# Patient Record
Sex: Female | Born: 1980 | Race: White | Hispanic: No | Smoking: Never smoker
Health system: Southern US, Community
[De-identification: ages and names within clinical notes are randomized; demographics above are authoritative.]

## PROBLEM LIST (undated history)

## (undated) DIAGNOSIS — B9689 Other specified bacterial agents as the cause of diseases classified elsewhere: Secondary | ICD-10-CM

## (undated) DIAGNOSIS — N760 Acute vaginitis: Secondary | ICD-10-CM

## (undated) DIAGNOSIS — R011 Cardiac murmur, unspecified: Secondary | ICD-10-CM

## (undated) DIAGNOSIS — K219 Gastro-esophageal reflux disease without esophagitis: Secondary | ICD-10-CM

## (undated) HISTORY — DX: Cardiac murmur, unspecified: R01.1

## (undated) HISTORY — PX: COSMETIC SURGERY: SHX468

## (undated) HISTORY — PX: NASAL SEPTUM SURGERY: SHX37

## (undated) HISTORY — DX: Gastro-esophageal reflux disease without esophagitis: K21.9

## (undated) HISTORY — PX: TUBAL LIGATION: SHX77

---

## 2016-09-07 DIAGNOSIS — Z9851 Tubal ligation status: Secondary | ICD-10-CM | POA: Insufficient documentation

## 2019-11-17 ENCOUNTER — Other Ambulatory Visit (HOSPITAL_COMMUNITY)
Admission: RE | Admit: 2019-11-17 | Discharge: 2019-11-17 | Disposition: A | Discharge status: Home / Self Care | Payer: BC Managed Care – PPO | Source: Ambulatory Visit | Attending: Certified Nurse Midwife | Admitting: Certified Nurse Midwife

## 2019-11-17 ENCOUNTER — Other Ambulatory Visit: Payer: Self-pay

## 2019-11-17 ENCOUNTER — Encounter: Payer: Self-pay | Admitting: Certified Nurse Midwife

## 2019-11-17 ENCOUNTER — Ambulatory Visit: Payer: BC Managed Care – PPO | Admitting: Certified Nurse Midwife

## 2019-11-17 VITALS — BP 108/73 | HR 76 | Ht 68.0 in | Wt 132.1 lb

## 2019-11-17 DIAGNOSIS — Z23 Encounter for immunization: Secondary | ICD-10-CM

## 2019-11-17 DIAGNOSIS — R300 Dysuria: Secondary | ICD-10-CM | POA: Diagnosis not present

## 2019-11-17 DIAGNOSIS — B373 Candidiasis of vulva and vagina: Secondary | ICD-10-CM | POA: Insufficient documentation

## 2019-11-17 DIAGNOSIS — N760 Acute vaginitis: Secondary | ICD-10-CM | POA: Diagnosis not present

## 2019-11-17 DIAGNOSIS — N898 Other specified noninflammatory disorders of vagina: Secondary | ICD-10-CM | POA: Insufficient documentation

## 2019-11-17 DIAGNOSIS — B9689 Other specified bacterial agents as the cause of diseases classified elsewhere: Secondary | ICD-10-CM | POA: Insufficient documentation

## 2019-11-17 NOTE — Patient Instructions (Signed)
Vaginitis Vaginitis is a condition in which the vaginal tissue swells and becomes red (inflamed). This condition is most often caused by a change in the normal balance of bacteria and yeast that live in the vagina. This change causes an overgrowth of certain bacteria or yeast, which causes the inflammation. There are different types of vaginitis, but the most common types are:  Bacterial vaginosis.  Yeast infection (candidiasis).  Trichomoniasis vaginitis. This is a sexually transmitted disease (STD).  Viral vaginitis.  Atrophic vaginitis.  Allergic vaginitis. What are the causes? The cause of this condition depends on the type of vaginitis. It can be caused by:  Bacteria (bacterial vaginosis).  Yeast, which is a fungus (yeast infection).  A parasite (trichomoniasis vaginitis).  A virus (viral vaginitis).  Low hormone levels (atrophic vaginitis). Low hormone levels can occur during pregnancy, breastfeeding, or after menopause.  Irritants, such as bubble baths, scented tampons, and feminine sprays (allergic vaginitis). Other factors can change the normal balance of the yeast and bacteria that live in the vagina. These include:  Antibiotic medicines.  Poor hygiene.  Diaphragms, vaginal sponges, spermicides, birth control pills, and intrauterine devices (IUD).  Sex.  Infection.  Uncontrolled diabetes.  A weakened defense (immune) system. What increases the risk? This condition is more likely to develop in women who:  Smoke.  Use vaginal douches, scented tampons, or scented sanitary pads.  Wear tight-fitting pants.  Wear thong underwear.  Use oral birth control pills or an IUD.  Have sex without a condom.  Have multiple sex partners.  Have an STD.  Frequently use the spermicide nonoxynol-9.  Eat lots of foods high in sugar.  Have uncontrolled diabetes.  Have low estrogen levels.  Have a weakened immune system from an immune disorder or medical  treatment.  Are pregnant or breastfeeding. What are the signs or symptoms? Symptoms vary depending on the cause of the vaginitis. Common symptoms include:  Abnormal vaginal discharge. ? The discharge is white, gray, or yellow with bacterial vaginosis. ? The discharge is thick, white, and cheesy with a yeast infection. ? The discharge is frothy and yellow or greenish with trichomoniasis.  A bad vaginal smell. The smell is fishy with bacterial vaginosis.  Vaginal itching, pain, or swelling.  Sex that is painful.  Pain or burning when urinating. Sometimes there are no symptoms. How is this diagnosed? This condition is diagnosed based on your symptoms and medical history. A physical exam, including a pelvic exam, will also be done. You may also have other tests, including:  Tests to determine the pH level (acidity or alkalinity) of your vagina.  A whiff test, to assess the odor that results when a sample of your vaginal discharge is mixed with a potassium hydroxide solution.  Tests of vaginal fluid. A sample will be examined under a microscope. How is this treated? Treatment varies depending on the type of vaginitis you have. Your treatment may include:  Antibiotic creams or pills to treat bacterial vaginosis and trichomoniasis.  Antifungal medicines, such as vaginal creams or suppositories, to treat a yeast infection.  Medicine to ease discomfort if you have viral vaginitis. Your sexual partner should also be treated.  Estrogen delivered in a cream, pill, suppository, or vaginal ring to treat atrophic vaginitis. If vaginal dryness occurs, lubricants and moisturizing creams may help. You may need to avoid scented soaps, sprays, or douches.  Stopping use of a product that is causing allergic vaginitis. Then using a vaginal cream to treat the symptoms. Follow   these instructions at home: Lifestyle  Keep your genital area clean and dry. Avoid soap, and only rinse the area with  water.  Do not douche or use tampons until your health care provider says it is okay to do so. Use sanitary pads, if needed.  Do not have sex until your health care provider approves. When you can return to sex, practice safe sex and use condoms.  Wipe from front to back. This avoids the spread of bacteria from the rectum to the vagina. General instructions  Take over-the-counter and prescription medicines only as told by your health care provider.  If you were prescribed an antibiotic medicine, take or use it as told by your health care provider. Do not stop taking or using the antibiotic even if you start to feel better.  Keep all follow-up visits as told by your health care provider. This is important. How is this prevented?  Use mild, non-scented products. Do not use things that can irritate the vagina, such as fabric softeners. Avoid the following products if they are scented: ? Feminine sprays. ? Detergents. ? Tampons. ? Feminine hygiene products. ? Soaps or bubble baths.  Let air reach your genital area. ? Wear cotton underwear to reduce moisture buildup. ? Avoid wearing underwear while you sleep. ? Avoid wearing tight pants and underwear or nylons without a cotton panel. ? Avoid wearing thong underwear.  Take off any wet clothing, such as bathing suits, as soon as possible.  Practice safe sex and use condoms. Contact a health care provider if:  You have abdominal pain.  You have a fever.  You have symptoms that last for more than 2-3 days. Get help right away if:  You have a fever and your symptoms suddenly get worse. Summary  Vaginitis is a condition in which the vaginal tissue becomes inflamed.This condition is most often caused by a change in the normal balance of bacteria and yeast that live in the vagina.  Treatment varies depending on the type of vaginitis you have.  Do not douche, use tampons , or have sex until your health care provider approves. When  you can return to sex, practice safe sex and use condoms. This information is not intended to replace advice given to you by your health care provider. Make sure you discuss any questions you have with your health care provider. Document Revised: 01/04/2017 Document Reviewed: 02/28/2016 Elsevier Patient Education  2020 [employer]  

## 2019-11-17 NOTE — Progress Notes (Signed)
GYN ENCOUNTER NOTE  Subjective:       Jordan Schwartz is a 39 y.o. G24P2002 female is here for gynecologic evaluation of the following issues:  1. Pt has a new partner and has been experiencing increased vaginal discharge with odor and burning. She has a history of UTI's as a child.  She has seen her pcp and had urine culture that showed mixed flora. She has been treated once for BV.    Gynecologic History Patient's last menstrual period was 11/08/2019 (exact date). Contraception: tubal ligation Last Pap: 2 yrs ago Results were: normal Last mammogram: n/a   Obstetric History OB History  Gravida Para Term Preterm AB Living  2 2 2     2   SAB TAB Ectopic Multiple Live Births          2    # Outcome Date GA Lbr Len/2nd Weight Sex Delivery Anes PTL Lv  2 Term 06/23/13   8 lb 6 oz (3.799 kg) M CS-Unspec  N LIV  1 Term 11/04/10   7 lb 15 oz (3.6 kg) M CS-Unspec  N LIV    History reviewed. No pertinent past medical history.  Past Surgical History:  Procedure Laterality Date  . CESAREAN SECTION    . NASAL SEPTUM SURGERY    . TUBAL LIGATION      Current Outpatient Medications on File Prior to Visit  Medication Sig Dispense Refill  . Biotin 1 MG CAPS Take by mouth.    . Multiple Vitamin (MULTI-VITAMIN) tablet Take 1 tablet by mouth every morning.    . norethindrone-ethinyl estradiol-iron (BLISOVI FE 1.5/30) 1.5-30 MG-MCG tablet Take 1 tablet by mouth daily.     No current facility-administered medications on file prior to visit.    Allergies  Allergen Reactions  . Penicillins Rash    Social History   Socioeconomic History  . Marital status: Legally Separated    Spouse name: Not on file  . Number of children: Not on file  . Years of education: Not on file  . Highest education level: Not on file  Occupational History  . Not on file  Tobacco Use  . Smoking status: Never Smoker  . Smokeless tobacco: Never Used  Substance and Sexual Activity  . Alcohol use: Yes  . Drug  use: Never  . Sexual activity: Yes    Birth control/protection: Surgical, Pill  Other Topics Concern  . Not on file  Social History Narrative  . Not on file   Social Determinants of Health   Financial Resource Strain:   . Difficulty of Paying Living Expenses: Not on file  Food Insecurity:   . Worried About 12-09-1976 in the Last Year: Not on file  . Ran Out of Food in the Last Year: Not on file  Transportation Needs:   . Lack of Transportation (Medical): Not on file  . Lack of Transportation (Non-Medical): Not on file  Physical Activity:   . Days of Exercise per Week: Not on file  . Minutes of Exercise per Session: Not on file  Stress:   . Feeling of Stress : Not on file  Social Connections:   . Frequency of Communication with Friends and Family: Not on file  . Frequency of Social Gatherings with Friends and Family: Not on file  . Attends Religious Services: Not on file  . Active Member of Clubs or Organizations: Not on file  . Attends Programme researcher, broadcasting/film/video Meetings: Not on file  . Marital Status:  Not on file  Intimate Partner Violence:   . Fear of Current or Ex-Partner: Not on file  . Emotionally Abused: Not on file  . Physically Abused: Not on file  . Sexually Abused: Not on file    History reviewed. No pertinent family history.  The following portions of the patient's history were reviewed and updated as appropriate: allergies, current medications, past family history, past medical history, past social history, past surgical history and problem list.  Review of Systems Review of Systems - Negative except as mentioned in HPI Review of Systems - General ROS: negative for - chills, fatigue, fever, hot flashes, malaise or night sweats Hematological and Lymphatic ROS: negative for - bleeding problems or swollen lymph nodes Gastrointestinal ROS: negative for - abdominal pain, blood in stools, change in bowel habits and nausea/vomiting Musculoskeletal ROS: negative  for - joint pain, muscle pain or muscular weakness Genito-Urinary ROS: negative for - change in menstrual cycle, dysmenorrhea, dyspareunia, , genital ulcers, hematuria, incontinence, irregular/heavy menses, nocturia or pelvic pain. Positive for dysuria, vaginal odor and discharge.  Objective:   BP 108/73   Pulse 76   Ht 5\' 8"  (1.727 m)   Wt 132 lb 1 oz (59.9 kg)   LMP 11/08/2019 (Exact Date)   BMI 20.08 kg/m  CONSTITUTIONAL: Well-developed, well-nourished female in no acute distress.  HENT:  Normocephalic, atraumatic.  NECK: Normal range of motion, supple, no masses.  Normal thyroid.  SKIN: Skin is warm and dry. No rash noted. Not diaphoretic. No erythema. No pallor. NEUROLGIC: Alert and oriented to person, place, and time. PSYCHIATRIC: Normal mood and affect. Normal behavior. Normal judgment and thought content. CARDIOVASCULAR:Not Examined RESPIRATORY: Not Examined BREASTS: Not Examined ABDOMEN: Soft, non distended; Non tender.  No Organomegaly. PELVIC:  External Genitalia: Normal  BUS: Normal  Vagina: Normal  Cervix: Normal, white to yellow discharge, odor noted, no cervical motion tenderness  Uterus: Normal size, shape,consistency, mobile  Adnexa: Normal  RV: Normal   Bladder: Nontender MUSCULOSKELETAL: Normal range of motion. No tenderness.  No cyanosis, clubbing, or edema.     Assessment:   1. Vaginal odor - Cervicovaginal ancillary only  2. Vaginal discharge - Cervicovaginal ancillary only  3. Burning with urination     Plan:   Discussed common caused for imbalance of PH , discussed use of boric acid vaginal suppository or replens to maintain ph balance. Encouraged use lubrication during intercourse to decrease irritation. Will follow up with lab results. Return prn.   01/08/2020, CNM

## 2019-11-18 NOTE — Addendum Note (Signed)
Addended by: Brooke Dare on: 11/18/2019 08:36 AM   Modules accepted: Orders

## 2019-11-19 ENCOUNTER — Other Ambulatory Visit: Payer: Self-pay | Admitting: Certified Nurse Midwife

## 2019-11-19 LAB — CERVICOVAGINAL ANCILLARY ONLY
Bacterial Vaginitis (gardnerella): POSITIVE — AB
Candida Glabrata: NEGATIVE
Candida Vaginitis: POSITIVE — AB
Chlamydia: NEGATIVE
Comment: NEGATIVE
Comment: NEGATIVE
Comment: NEGATIVE
Comment: NEGATIVE
Comment: NEGATIVE
Comment: NORMAL
Neisseria Gonorrhea: NEGATIVE
Trichomonas: NEGATIVE

## 2019-11-19 MED ORDER — METRONIDAZOLE 500 MG PO TABS: 500.0000 mg | ORAL_TABLET | Freq: Two times a day (BID) | ORAL | 1 refills | Status: AC

## 2019-11-19 MED ORDER — FLUCONAZOLE 150 MG PO TABS: 150.0000 mg | ORAL_TABLET | Freq: Once | ORAL | 0 refills | Status: AC

## 2019-11-19 NOTE — Progress Notes (Signed)
Vaginal swab positive for yeast and BV, orders placed. PT notified via my chart.   Doreene Burke, CNM

## 2019-11-20 ENCOUNTER — Telehealth: Payer: Self-pay

## 2019-11-20 ENCOUNTER — Other Ambulatory Visit: Payer: Self-pay | Admitting: Certified Nurse Midwife

## 2019-11-20 LAB — URINE CULTURE

## 2019-11-20 MED ORDER — VALACYCLOVIR HCL 1 G PO TABS: 1000.0000 mg | ORAL_TABLET | Freq: Every day | ORAL | 11 refills | Status: DC

## 2019-11-20 MED ORDER — METRONIDAZOLE 0.75 % VA GEL: 1.0000 | VAGINAL | 3 refills | Status: DC

## 2019-11-20 NOTE — Progress Notes (Signed)
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

## 2019-11-20 NOTE — Telephone Encounter (Signed)
Due to transmission issues Valtrex 1000 mg #30 take 1 tablet daily x 11 refills Metrogel Vaginal gel 0.75% gel 70g  Place 1 applicator full vaginally 2 (two) times per week x 3 refills VO [facility]

## 2019-12-18 ENCOUNTER — Emergency Department (HOSPITAL_BASED_OUTPATIENT_CLINIC_OR_DEPARTMENT_OTHER): Payer: BC Managed Care – PPO

## 2019-12-18 ENCOUNTER — Ambulatory Visit
Admission: EM | Admit: 2019-12-18 | Discharge: 2019-12-18 | Disposition: A | Discharge status: Home / Self Care | Payer: BC Managed Care – PPO

## 2019-12-18 ENCOUNTER — Emergency Department (HOSPITAL_BASED_OUTPATIENT_CLINIC_OR_DEPARTMENT_OTHER)
Admission: EM | Admit: 2019-12-18 | Discharge: 2019-12-18 | Disposition: A | Discharge status: Home / Self Care | Payer: BC Managed Care – PPO | Attending: Emergency Medicine | Admitting: Emergency Medicine

## 2019-12-18 ENCOUNTER — Other Ambulatory Visit: Payer: Self-pay

## 2019-12-18 ENCOUNTER — Encounter (HOSPITAL_BASED_OUTPATIENT_CLINIC_OR_DEPARTMENT_OTHER): Payer: Self-pay | Admitting: *Deleted

## 2019-12-18 DIAGNOSIS — R10813 Right lower quadrant abdominal tenderness: Secondary | ICD-10-CM | POA: Insufficient documentation

## 2019-12-18 DIAGNOSIS — K5900 Constipation, unspecified: Secondary | ICD-10-CM | POA: Diagnosis not present

## 2019-12-18 DIAGNOSIS — R1030 Lower abdominal pain, unspecified: Secondary | ICD-10-CM | POA: Diagnosis not present

## 2019-12-18 DIAGNOSIS — R10814 Left lower quadrant abdominal tenderness: Secondary | ICD-10-CM | POA: Diagnosis not present

## 2019-12-18 DIAGNOSIS — R109 Unspecified abdominal pain: Secondary | ICD-10-CM | POA: Diagnosis present

## 2019-12-18 DIAGNOSIS — R1031 Right lower quadrant pain: Secondary | ICD-10-CM

## 2019-12-18 LAB — COMPREHENSIVE METABOLIC PANEL
ALT: 22 U/L (ref 0–44)
AST: 18 U/L (ref 15–41)
Albumin: 4 g/dL (ref 3.5–5.0)
Alkaline Phosphatase: 43 U/L (ref 38–126)
Anion gap: 8 (ref 5–15)
BUN: 18 mg/dL (ref 6–20)
CO2: 26 mmol/L (ref 22–32)
Calcium: 8.7 mg/dL — ABNORMAL LOW (ref 8.9–10.3)
Chloride: 103 mmol/L (ref 98–111)
Creatinine, Ser: 0.88 mg/dL (ref 0.44–1.00)
GFR, Estimated: >60 mL/min (ref >60)
Glucose, Bld: 74 mg/dL (ref 70–99)
Potassium: 3.6 mmol/L (ref 3.5–5.1)
Sodium: 137 mmol/L (ref 135–145)
Total Bilirubin: 0.4 mg/dL (ref 0.3–1.2)
Total Protein: 7.1 g/dL (ref 6.5–8.1)

## 2019-12-18 LAB — CBC
HCT: 42.1 % (ref 36.0–46.0)
Hemoglobin: 13.8 g/dL (ref 12.0–15.0)
MCH: 31.7 pg (ref 26.0–34.0)
MCHC: 32.8 g/dL (ref 30.0–36.0)
MCV: 96.6 fL (ref 80.0–100.0)
Platelets: 310 10*3/uL (ref 150–400)
RBC: 4.36 MIL/uL (ref 3.87–5.11)
RDW: 12.5 % (ref 11.5–15.5)
WBC: 8.3 10*3/uL (ref 4.0–10.5)
nRBC: 0 % (ref 0.0–0.2)

## 2019-12-18 LAB — URINALYSIS, MICROSCOPIC (REFLEX)

## 2019-12-18 LAB — URINALYSIS, ROUTINE W REFLEX MICROSCOPIC
Bilirubin Urine: NEGATIVE
Glucose, UA: NEGATIVE mg/dL
Ketones, ur: NEGATIVE mg/dL
Leukocytes,Ua: NEGATIVE
Nitrite: NEGATIVE
Protein, ur: NEGATIVE mg/dL
Specific Gravity, Urine: 1.025 (ref 1.005–1.030)
pH: 7 (ref 5.0–8.0)

## 2019-12-18 LAB — PREGNANCY, URINE: Preg Test, Ur: NEGATIVE

## 2019-12-18 LAB — LIPASE, BLOOD: Lipase: 20 U/L (ref 11–51)

## 2019-12-18 MED ORDER — IOHEXOL 300 MG/ML  SOLN
100.0000 mL | Freq: Once | INTRAMUSCULAR | Status: AC | PRN
  Administered 2019-12-18: 100 mL via INTRAVENOUS

## 2019-12-18 NOTE — Discharge Instructions (Signed)
Take Miralax at home  Return for new or worsening symptoms

## 2019-12-18 NOTE — ED Triage Notes (Signed)
Lower abdominal pain x 3 days. She was seen at [facility] today and told to come here to r.o out ovarian cyst vs appendicitis. Hx of ovarian cyst.

## 2019-12-18 NOTE — ED Triage Notes (Signed)
Pt reports having abd pain x3 days. Reports feeling bloated, last BM this morning but does not feel like "I have completely relieved myself".

## 2019-12-18 NOTE — Discharge Instructions (Addendum)
Go to the ER for further evaluation and treatment. 

## 2019-12-18 NOTE — ED Provider Notes (Signed)
[facility] EMERGENCY DEPARTMENT Provider Note   CSN: [id number] Arrival date & time: 12/18/19  1253     History Chief Complaint  Patient presents with  . Abdominal Pain    Jordan Schwartz is a 39 y.o. female with past medical history significant for ovarian cysts, tubal ligation who presents for evaluation of abdominal pain x3 days.  Pain located to right lower quadrant.  States she has had some bloating.  Her last bowel movement was this morning however does not feel like "I completely relieve myself."  Not worse with movement.  She has not taken anything for her pain.  Was seen by urgent care sent here to emergency department to rule out ovarian cyst versus appendicitis.  Denies fever, chills, nausea, vomiting, diarrhea, vaginal discharge, concerns for STDs, dysuria, hematuria.  She rates her pain a 4/10.  Describes as aching.  Will occasionally go to the left lower quadrant.  No suprapubic pain.  States her last few bowel movements have been "hard and small."  Has not take anything for symptoms.  Does not want anything for pain at this time.  History obtained from patient and past medical records.  No interpreter is used.  HPI     History reviewed. No pertinent past medical history.  Patient Active Problem List   Diagnosis Date Noted  . Vaginal odor 11/17/2019    Past Surgical History:  Procedure Laterality Date  . CESAREAN SECTION    . NASAL SEPTUM SURGERY    . TUBAL LIGATION       OB History    Gravida  2   Para  2   Term  2   Preterm      AB      Living  2     SAB      TAB      Ectopic      Multiple      Live Births  2           No family history on file.  Social History   Tobacco Use  . Smoking status: Never Smoker  . Smokeless tobacco: Never Used  Substance Use Topics  . Alcohol use: Yes  . Drug use: Never    Home Medications Prior to Admission medications   Medication Sig Start Date End Date Taking? Authorizing  Provider  acetaminophen (TYLENOL) 500 MG tablet Take 1,000 mg by mouth every 6 (six) hours as needed for mild pain.   Yes [provider]  Biotin 1 MG CAPS Take 1 capsule by mouth daily.    Yes [provider]  ibuprofen (ADVIL) 200 MG tablet Take 400 mg by mouth every 6 (six) hours as needed for moderate pain.   Yes [provider]  metroNIDAZOLE (METROGEL VAGINAL) 0.75 % vaginal gel Place 1 Applicatorful vaginally 2 (two) times a week. 11/23/19 05/21/20 Yes Doreene Burke, CNM  Multiple Vitamin (MULTI-VITAMIN) tablet Take 1 tablet by mouth every morning.   Yes [provider]  norethindrone-ethinyl estradiol-iron (BLISOVI FE 1.5/30) 1.5-30 MG-MCG tablet Take 1 tablet by mouth daily. 11/03/18  Yes [provider]    Allergies    Penicillins  Review of Systems   Review of Systems  Constitutional: Negative.   HENT: Negative.   Respiratory: Negative.   Cardiovascular: Negative.   Gastrointestinal: Positive for abdominal pain and constipation. Negative for abdominal distention, anal bleeding, blood in stool, diarrhea, nausea, rectal pain and vomiting.  Genitourinary: Negative.   Musculoskeletal: Negative.  Skin: Negative.   Neurological: Negative.   All other systems reviewed and are negative.   Physical Exam Updated Vital Signs BP 127/80   Pulse 80   Temp 98.4 F (36.9 C) (Oral)   Resp 20   Ht 5\' 8"  (1.727 m)   Wt 61.7 kg   LMP 12/06/2019   SpO2 99%   BMI 20.68 kg/m   Physical Exam Vitals and nursing note reviewed.  Constitutional:      General: She is not in acute distress.    Appearance: She is well-developed. She is not ill-appearing, toxic-appearing or diaphoretic.  HENT:     Head: Normocephalic and atraumatic.  Eyes:     Pupils: Pupils are equal, round, and reactive to light.  Cardiovascular:     Rate and Rhythm: Normal rate.     Heart sounds: Normal heart sounds.  Pulmonary:     Effort: Pulmonary effort is normal.  No respiratory distress.     Breath sounds: Normal breath sounds.  Abdominal:     General: Bowel sounds are normal. There is no distension.     Palpations: Abdomen is soft.     Tenderness: There is abdominal tenderness in the right lower quadrant and left lower quadrant. There is no right CVA tenderness, left CVA tenderness, guarding or rebound. Negative signs include Murphy's sign, Rovsing's sign, psoas sign and obturator sign.     Hernia: No hernia is present.     Comments: Diffuse tenderness to lower abdomen worse to right lower quadrant.  No rebound or guarding.  Mild tenderness at McBurney point however negative psoas, obturator sign.  Genitourinary:    Comments: Declined Musculoskeletal:        General: Normal range of motion.     Cervical back: Normal range of motion.     Comments: Moves all 4 extremities without difficulty.  Apartment soft.  No bony tenderness.  Skin:    General: Skin is warm and dry.     Capillary Refill: Capillary refill takes less than 2 seconds.     Comments: No edema, erythema or warmth.  No fluctuance or induration.  Neurological:     Mental Status: She is alert.     Comments: Cranial nerves II to XII grossly intact. Ambulatory without difficulty.    ED Results / Procedures / Treatments   Labs (all labs ordered are listed, but only abnormal results are displayed) Labs Reviewed  COMPREHENSIVE METABOLIC PANEL - Abnormal; Notable for the following components:      Result Value   Calcium 8.7 (*)    All other components within normal limits  URINALYSIS, ROUTINE W REFLEX MICROSCOPIC - Abnormal; Notable for the following components:   Hgb urine dipstick TRACE (*)    All other components within normal limits  URINALYSIS, MICROSCOPIC (REFLEX) - Abnormal; Notable for the following components:   Bacteria, UA RARE (*)    All other components within normal limits  LIPASE, BLOOD  CBC  PREGNANCY, URINE    EKG None  Radiology CT Abdomen Pelvis W  Contrast  Result Date: 12/18/2019 CLINICAL DATA:  Right lower quadrant abdominal pain for 3 days EXAM: CT ABDOMEN AND PELVIS WITH CONTRAST TECHNIQUE: Multidetector CT imaging of the abdomen and pelvis was performed using the standard protocol following bolus administration of intravenous contrast. CONTRAST:  100mL OMNIPAQUE IOHEXOL 300 MG/ML  SOLN COMPARISON:  None. FINDINGS: Lower chest: No acute pleural or parenchymal lung disease. Hepatobiliary: No focal liver abnormality is seen. No gallstones, gallbladder wall thickening, or  biliary dilatation. Pancreas: Unremarkable. No pancreatic ductal dilatation or surrounding inflammatory changes. Spleen: Normal in size without focal abnormality. Adrenals/Urinary Tract: Adrenal glands are unremarkable. Kidneys are normal, without renal calculi, focal lesion, or hydronephrosis. Bladder is unremarkable. Stomach/Bowel: No bowel obstruction or ileus. Normal gas-filled appendix right lower quadrant. No bowel wall thickening or inflammatory change. Vascular/Lymphatic: No significant vascular findings are present. No enlarged abdominal or pelvic lymph nodes. Reproductive: Uterus and bilateral adnexa are unremarkable. Other: Trace pelvic free fluid is likely physiologic. No free intraperitoneal gas. No abdominal wall hernia. Musculoskeletal: No acute or destructive bony lesions. Reconstructed images demonstrate no additional findings. IMPRESSION: 1. No acute intra-abdominal process.  Normal appendix. 2. Trace pelvic free fluid, likely physiologic. Electronically Signed   By: Sharlet Salina M.D.   On: 12/18/2019 15:40   US PELVIC COMPLETE W TRANSVAGINAL AND TORSION R/O  Result Date: 12/18/2019 CLINICAL DATA:  Acute right lower quadrant abdominal pain. EXAM: TRANSABDOMINAL AND TRANSVAGINAL ULTRASOUND OF PELVIS TECHNIQUE: Both transabdominal and transvaginal ultrasound examinations of the pelvis were performed. Transabdominal technique was performed for global imaging of the  pelvis including uterus, ovaries, adnexal regions, and pelvic cul-de-sac. It was necessary to proceed with endovaginal exam following the transabdominal exam to visualize the endometrium and ovaries. COMPARISON:  None FINDINGS: Uterus Measurements: 6.5 x 4.0 x 4.0 cm = volume: 55 mL. No fibroids or other mass visualized. Endometrium Thickness: 5 mm which is within normal limits. No focal abnormality visualized. Right ovary Not visualized. Left ovary Not visualized. Other findings No abnormal free fluid. IMPRESSION: Uterus and endometrium unremarkable. Ovaries are not visualized. No definite adnexal abnormality is noted. Electronically Signed   By: Lupita Raider M.D.   On: 12/18/2019 14:28    Procedures Procedures (including critical care time)  Medications Ordered in ED Medications  iohexol (OMNIPAQUE) 300 MG/ML solution 100 mL (100 mLs Intravenous Contrast Given 12/18/19 1518)   ED Course  I have reviewed the triage vital signs and the nursing notes.  Pertinent labs & imaging results that were available during my care of the patient were reviewed by me and considered in my medical decision making (see chart for details).  39 year old presents for evaluation of diffuse lower abdominal pain however worse to right lower quadrant. Hx of ovarian cyst.  Has been constipated at home.  No urinary complaints.  She is afebrile, nonseptic, not ill-appearing.  She denies any concerns for any STDs.  Passing flatulence without difficulty.  Heart and lungs clear.  Abdomen diffusely tender to lower abdomen however worse at right lower quadrant.  Negative psoas, Murphy sign.  Plan on labs, imaging and reassess.  She does not want anything for pain at this time. Declined GU exam.  Labs and imaging personally reviewed and interpreted:  Urinalysis negative for infection Lipase 20 Metabolic panel without electrolyte, renal abnormality Pregnancy test negative CBC without leukocytosis Ultrasound pelvis without  any acute intrapelvic pathology however unable to visualize the ovaries. CT abdomen with trace free fluid in pelvis.  Per my interpretation patient does have moderate stool.  Patient reassessed.  She has not needed anything for pain at this time.  She is tolerating p.o. intake without difficulty.  Question possible ruptured ovarian cyst versus constipation as cause of her pain.  Discussed no definitive clear etiology with patient.  Patient is nontoxic, nonseptic appearing, in no apparent distress.  Patient's pain and other symptoms adequately managed in emergency department.  Fluid bolus given.  Labs, imaging and vitals reviewed.  Patient does  not meet the SIRS or Sepsis criteria.  On repeat exam patient does not have a surgical abdomin and there are no peritoneal signs.  No indication of appendicitis, bowel obstruction, bowel perforation, cholecystitis, diverticulitis, PID, TOA, Ovarian torsion or ectopic pregnancy.   The patient has been appropriately medically screened and/or stabilized in the ED. I have low suspicion for any other emergent medical condition which would require further screening, evaluation or treatment in the ED or require inpatient management.  Patient is hemodynamically stable and in no acute distress.  Patient able to ambulate in department prior to ED.  Evaluation does not show acute pathology that would require ongoing or additional emergent interventions while in the emergency department or further inpatient treatment.  I have discussed the diagnosis with the patient and answered all questions.  Pain is been managed while in the emergency department and patient has no further complaints prior to discharge.  Patient is comfortable with plan discussed in room and is stable for discharge at this time.  I have discussed strict return precautions for returning to the emergency department.  Patient was encouraged to follow-up with PCP/specialist refer to at discharge.    MDM  Rules/Calculators/A&P                           Final Clinical Impression(s) / ED Diagnoses Final diagnoses:  Constipation, unspecified constipation type  Lower abdominal pain    Rx / DC Orders ED Discharge Orders    None       Donte Kary A, PA-C 12/18/19 1610    Benjiman Core, MD 12/21/19 [phone]

## 2020-01-08 ENCOUNTER — Ambulatory Visit
Admission: EM | Admit: 2020-01-08 | Discharge: 2020-01-08 | Disposition: A | Discharge status: Home / Self Care | Payer: BC Managed Care – PPO | Attending: Emergency Medicine | Admitting: Emergency Medicine

## 2020-01-08 DIAGNOSIS — N390 Urinary tract infection, site not specified: Secondary | ICD-10-CM

## 2020-01-08 DIAGNOSIS — Z113 Encounter for screening for infections with a predominantly sexual mode of transmission: Secondary | ICD-10-CM

## 2020-01-08 DIAGNOSIS — R300 Dysuria: Secondary | ICD-10-CM | POA: Diagnosis present

## 2020-01-08 HISTORY — DX: Acute vaginitis: N76.0

## 2020-01-08 HISTORY — DX: Other specified bacterial agents as the cause of diseases classified elsewhere: B96.89

## 2020-01-08 LAB — POCT URINALYSIS DIP (MANUAL ENTRY)
Glucose, UA: 250 mg/dL — AB
Nitrite, UA: POSITIVE — AB
Protein Ur, POC: 100 mg/dL — AB
Spec Grav, UA: 1.01 (ref 1.010–1.025)
Urobilinogen, UA: 4 E.U./dL — AB
pH, UA: 5 (ref 5.0–8.0)

## 2020-01-08 LAB — POCT URINE PREGNANCY: Preg Test, Ur: NEGATIVE

## 2020-01-08 MED ORDER — NITROFURANTOIN MONOHYD MACRO 100 MG PO CAPS: 100.0000 mg | ORAL_CAPSULE | Freq: Two times a day (BID) | ORAL | 0 refills | Status: DC

## 2020-01-08 NOTE — ED Provider Notes (Signed)
Renaldo Fiddler    CSN: [id number] Arrival date & time: 01/08/20  0254      History   Chief Complaint Chief Complaint  Patient presents with  . Dysuria    HPI Jordan Schwartz is a 39 y.o. female.   Patient presents with 3-day history of dysuria and frequency.  She states her symptoms are worse since this morning.  She took Azo for her symptoms.  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  She denies fever, chills, abdominal pain, back pain, pelvic pain, vaginal discharge, or other symptoms.  The history is provided by the patient.    Past Medical History:  Diagnosis Date  . Bacterial vaginosis     Patient Active Problem List   Diagnosis Date Noted  . Vaginal odor 11/17/2019    Past Surgical History:  Procedure Laterality Date  . CESAREAN SECTION    . NASAL SEPTUM SURGERY    . TUBAL LIGATION      OB History    Gravida  2   Para  2   Term  2   Preterm      AB      Living  2     SAB      TAB      Ectopic      Multiple      Live Births  2            Home Medications    Prior to Admission medications   Medication Sig Start Date End Date Taking? Authorizing Provider  acetaminophen (TYLENOL) 500 MG tablet Take 1,000 mg by mouth every 6 (six) hours as needed for mild pain.    [provider]  Biotin 1 MG CAPS Take 1 capsule by mouth daily.     [provider]  ibuprofen (ADVIL) 200 MG tablet Take 400 mg by mouth every 6 (six) hours as needed for moderate pain.    [provider]  metroNIDAZOLE (METROGEL VAGINAL) 0.75 % vaginal gel Place 1 Applicatorful vaginally 2 (two) times a week. 11/23/19 05/21/20  Doreene Burke, CNM  Multiple Vitamin (MULTI-VITAMIN) tablet Take 1 tablet by mouth every morning.    [provider]  nitrofurantoin, macrocrystal-monohydrate, (MACROBID) 100 MG capsule Take 1 capsule (100 mg total) by mouth 2 (two)  times daily. 01/08/20   Mickie Bail, NP  norethindrone-ethinyl estradiol-iron (BLISOVI FE 1.5/30) 1.5-30 MG-MCG tablet Take 1 tablet by mouth daily. 11/03/18   [provider]    Family History Family History  Problem Relation Age of Onset  . Osteoporosis Mother   . Hypertension Father   . Diabetes Father     Social History Social History   Tobacco Use  . Smoking status: Never Smoker  . Smokeless tobacco: Never Used  Substance Use Topics  . Alcohol use: Yes    Comment: socially  . Drug use: Never     Allergies   Penicillins   Review of Systems Review of Systems  Constitutional: Negative for chills and fever.  HENT: Negative for ear pain and sore throat.   Eyes: Negative for pain and visual disturbance.  Respiratory: Negative for cough and shortness of breath.   Cardiovascular: Negative for chest pain and palpitations.  Gastrointestinal: Negative for abdominal pain and vomiting.  Genitourinary: Positive for dysuria and frequency. Negative for flank pain, hematuria, pelvic pain and vaginal discharge.  Musculoskeletal: Negative for arthralgias and  back pain.  Skin: Negative for color change and rash.  Neurological: Negative for seizures and syncope.  All other systems reviewed and are negative.    Physical Exam Triage Vital Signs ED Triage Vitals  Enc Vitals Group     BP      Pulse      Resp      Temp      Temp src      SpO2      Weight      Height      Head Circumference      Peak Flow      Pain Score      Pain Loc      Pain Edu?      Excl. in GC?    No data found.  Updated Vital Signs BP 120/83 (BP Location: Right Arm)   Pulse 86   Temp 98.9 F (37.2 C) (Oral)   Resp 18   Ht 5\' 8"  (1.727 m)   Wt 130 lb (59 kg)   LMP 01/03/2020 Comment: oral contraceptives  SpO2 95%   BMI 19.77 kg/m   Visual Acuity Right Eye Distance:   Left Eye Distance:   Bilateral Distance:    Right Eye Near:   Left Eye Near:    Bilateral Near:      Physical Exam Vitals and nursing note reviewed.  Constitutional:      General: She is not in acute distress.    Appearance: She is well-developed. She is not ill-appearing.  HENT:     Head: Normocephalic and atraumatic.     Mouth/Throat:     Mouth: Mucous membranes are moist.  Eyes:     Conjunctiva/sclera: Conjunctivae normal.  Cardiovascular:     Rate and Rhythm: Normal rate and regular rhythm.     Heart sounds: Normal heart sounds.  Pulmonary:     Effort: Pulmonary effort is normal. No respiratory distress.     Breath sounds: Normal breath sounds.  Abdominal:     Palpations: Abdomen is soft.     Tenderness: There is no abdominal tenderness. There is no right CVA tenderness, left CVA tenderness, guarding or rebound.  Musculoskeletal:     Cervical back: Neck supple.  Skin:    General: Skin is warm and dry.     Findings: No rash.  Neurological:     General: No focal deficit present.     Mental Status: She is alert and oriented to person, place, and time.     Gait: Gait normal.  Psychiatric:        Mood and Affect: Mood normal.        Behavior: Behavior normal.      UC Treatments / Results  Labs (all labs ordered are listed, but only abnormal results are displayed) Labs Reviewed  POCT URINALYSIS DIP (MANUAL ENTRY) - Abnormal; Notable for the following components:      Result Value   Color, UA orange (*)    Glucose, UA =250 (*)    Bilirubin, UA small (*)    Ketones, POC UA small (15) (*)    Blood, UA small (*)    Protein Ur, POC =100 (*)    Urobilinogen, UA 4.0 (*)    Nitrite, UA Positive (*)    Leukocytes, UA Large (3+) (*)    All other components within normal limits  URINE CULTURE  POCT URINE PREGNANCY  CERVICOVAGINAL ANCILLARY ONLY    EKG   Radiology No results found.  Procedures  Procedures (including critical care time)  Medications Ordered in UC Medications - No data to display  Initial Impression / Assessment and Plan / UC Course  I have  reviewed the triage vital signs and the nursing notes.  Pertinent labs & imaging results that were available during my care of the patient were reviewed by me and considered in my medical decision making (see chart for details).   UTI.  Screening for STD.  Urine culture pending.  Treating with Macrobid.  Instructed patient that we will call her if her culture results show the need to change or discontinue her antibiotic.  Discussed that we will call her with any positive results from her vaginal self swab.  Discussed that she may need treatment at that time.  Patient agrees to plan of care.   Final Clinical Impressions(s) / UC Diagnoses   Final diagnoses:  Urinary tract infection without hematuria, site unspecified  Screen for STD (sexually transmitted disease)     Discharge Instructions     Take the antibiotic as directed.  The urine culture is pending.  We will call you if it shows the need to change or discontinue your antibiotic.    Your vaginal tests are pending.  If your test results are positive, we will call you.  You and your sexual partner(s) may require treatment at that time.  Do not have sexual activity until the test results are back.     ED Prescriptions    Medication Sig Dispense Auth. Provider   nitrofurantoin, macrocrystal-monohydrate, (MACROBID) 100 MG capsule Take 1 capsule (100 mg total) by mouth 2 (two) times daily. 10 capsule Mickie Bail, NP     PDMP not reviewed this encounter.   Mickie Bail, NP 01/08/20 [phone]

## 2020-01-08 NOTE — ED Triage Notes (Signed)
Pt presents to Urgent Care with c/o dysuria and frequency x 3 days. Took AZO this AM. Pt states she has frequent problems w/ bacterial vaginosis, taking "preventative gel."

## 2020-01-08 NOTE — Discharge Instructions (Signed)
Take the antibiotic as directed.  The urine culture is pending.  We will call you if it shows the need to change or discontinue your antibiotic.    Your vaginal tests are pending.  If your test results are positive, we will call you.  You and your sexual partner(s) may require treatment at that time.  Do not have sexual activity until the test results are back.      

## 2020-01-10 LAB — URINE CULTURE: Culture: 20000 — AB

## 2020-01-12 ENCOUNTER — Telehealth (HOSPITAL_COMMUNITY): Payer: Self-pay | Admitting: Emergency Medicine

## 2020-01-12 LAB — CERVICOVAGINAL ANCILLARY ONLY
Bacterial Vaginitis (gardnerella): POSITIVE — AB
Candida Glabrata: NEGATIVE
Candida Vaginitis: NEGATIVE
Chlamydia: NEGATIVE
Comment: NEGATIVE
Comment: NEGATIVE
Comment: NEGATIVE
Comment: NEGATIVE
Comment: NEGATIVE
Comment: NORMAL
Neisseria Gonorrhea: NEGATIVE
Trichomonas: NEGATIVE

## 2020-01-12 MED ORDER — METRONIDAZOLE 500 MG PO TABS: 500.0000 mg | ORAL_TABLET | Freq: Two times a day (BID) | ORAL | 0 refills | Status: DC

## 2020-02-01 ENCOUNTER — Other Ambulatory Visit: Payer: BC Managed Care – PPO

## 2020-02-01 DIAGNOSIS — Z20822 Contact with and (suspected) exposure to covid-19: Secondary | ICD-10-CM

## 2020-02-02 LAB — SARS-COV-2, NAA 2 DAY TAT

## 2020-02-02 LAB — NOVEL CORONAVIRUS, NAA: SARS-CoV-2, NAA: NOT DETECTED

## 2020-02-29 ENCOUNTER — Other Ambulatory Visit: Payer: Self-pay | Admitting: Certified Nurse Midwife

## 2020-02-29 MED ORDER — METRONIDAZOLE 0.75 % VA GEL: 1.0000 | VAGINAL | 6 refills | Status: DC

## 2020-02-29 MED ORDER — METRONIDAZOLE 500 MG PO TABS
500.0000 mg | ORAL_TABLET | Freq: Two times a day (BID) | ORAL | 0 refills | Status: DC
Start: 2020-02-29 — End: 2020-07-11

## 2020-07-11 ENCOUNTER — Encounter: Payer: Self-pay | Admitting: Certified Nurse Midwife

## 2020-07-11 ENCOUNTER — Ambulatory Visit: Payer: BC Managed Care – PPO | Admitting: Certified Nurse Midwife

## 2020-07-11 ENCOUNTER — Other Ambulatory Visit: Payer: Self-pay

## 2020-07-11 VITALS — BP 118/84 | HR 80 | Resp 16 | Ht 68.0 in | Wt 139.0 lb

## 2020-07-11 DIAGNOSIS — R4586 Emotional lability: Secondary | ICD-10-CM | POA: Diagnosis not present

## 2020-07-11 NOTE — Progress Notes (Signed)
GYN ENCOUNTER NOTE  Subjective:       Jordan Schwartz is a 40 y.o. G92P2002 female is here for gynecologic evaluation of the following issues:  1. Pt states that she missed a month of her pill a few months back when she lost a pack. Her insurance would not cover a replacement pack . She did not think anything of it because she has her tubes tied so she just went without [facility] for the month . She states she started to notice that she was very emotional/sensitive the week before her period . That things that usually would not bother her were very upsetting. She was having panic attack like symptoms feeling like her chest was heavy for no reason at all then she had her period 2 days later. States that symptoms resolve after she finishes her cycle. She states she take the [facility] for control of her cycle and for history of ovarian cysts. She has her tubes tied.    Gynecologic History No LMP recorded. Contraception: OCP (estrogen/progesterone) Lo Estrin  Last Pap: 2019 per pt  Results were: normal Last mammogram: n/a .  Obstetric History OB History  Gravida Para Term Preterm AB Living  2 2 2     2   SAB IAB Ectopic Multiple Live Births          2    # Outcome Date GA Lbr Len/2nd Weight Sex Delivery Anes PTL Lv  2 Term 06/23/13   8 lb 6 oz (3.799 kg) M CS-Unspec  N LIV  1 Term 11/04/10   7 lb 15 oz (3.6 kg) M CS-Unspec  N LIV    Past Medical History:  Diagnosis Date  . Bacterial vaginosis     Past Surgical History:  Procedure Laterality Date  . CESAREAN SECTION    . NASAL SEPTUM SURGERY    . TUBAL LIGATION      Current Outpatient Medications on File Prior to Visit  Medication Sig Dispense Refill  . acetaminophen (TYLENOL) 500 MG tablet Take 1,000 mg by mouth every 6 (six) hours as needed for mild pain.    . Multiple Vitamin (MULTI-VITAMIN) tablet Take 1 tablet by mouth every morning.    . norethindrone-ethinyl estradiol-iron (LOESTRIN FE) 1.5-30 MG-MCG tablet Take 1 tablet by mouth daily.     12-09-1976 ibuprofen (ADVIL) 200 MG tablet Take 400 mg by mouth every 6 (six) hours as needed for moderate pain. (Patient not taking: Reported on 07/11/2020)     No current facility-administered medications on file prior to visit.    Allergies  Allergen Reactions  . Penicillins Rash    Social History   Socioeconomic History  . Marital status: Legally Separated    Spouse name: Not on file  . Number of children: Not on file  . Years of education: Not on file  . Highest education level: Not on file  Occupational History  . Not on file  Tobacco Use  . Smoking status: Never Smoker  . Smokeless tobacco: Never Used  Substance and Sexual Activity  . Alcohol use: Yes    Comment: socially  . Drug use: Never  . Sexual activity: Yes    Birth control/protection: Surgical, Pill  Other Topics Concern  . Not on file  Social History Narrative  . Not on file   Social Determinants of Health   Financial Resource Strain: Not on file  Food Insecurity: Not on file  Transportation Needs: Not on file  Physical Activity: Not on file  Stress:  Not on file  Social Connections: Not on file  Intimate Partner Violence: Not on file    Family History  Problem Relation Age of Onset  . Osteoporosis Mother   . Hypertension Father   . Diabetes Father     The following portions of the patient's history were reviewed and updated as appropriate: allergies, current medications, past family history, past medical history, past social history, past surgical history and problem list.  Review of Systems Review of Systems - Negative except as mentioned in HPI Review of Systems - General ROS: negative for - chills, fatigue, fever, hot flashes, malaise or night sweats Hematological and Lymphatic ROS: negative for - bleeding problems or swollen lymph nodes Gastrointestinal ROS: negative for - abdominal pain, blood in stools, change in bowel habits and nausea/vomiting Musculoskeletal ROS: negative for - joint pain,  muscle pain or muscular weakness Genito-Urinary ROS: negative for - change in menstrual cycle, dysmenorrhea, dyspareunia, dysuria, genital discharge, genital ulcers, hematuria, incontinence, irregular/heavy menses, nocturia or pelvic pain.   Objective:   BP 118/84   Pulse 80   Resp 16   Ht 5\' 8"  (1.727 m)   Wt 139 lb (63 kg)   BMI 21.13 kg/m  CONSTITUTIONAL: Well-developed, well-nourished female in no acute distress.  HENT:  Normocephalic, atraumatic.  NECK: Normal range of motion, supple, no masses.  Normal thyroid.  SKIN: Skin is warm and dry. No rash noted. Not diaphoretic. No erythema. No pallor. NEUROLGIC: Alert and oriented to person, place, and time. PSYCHIATRIC: Normal mood and affect. Normal behavior. Normal judgment and thought content. CARDIOVASCULAR:Not Examined RESPIRATORY: Not Examined BREASTS: Not Examined ABDOMEN: Soft, non distended; Non tender.  No Organomegaly. PELVIC: not indicated  MUSCULOSKELETAL: Normal range of motion. No tenderness.  No cyanosis, clubbing, or edema.     Assessment:   Premenstrual mood disorder    Plan:   Discussed perimenopausal mood changes, BC hormonal changes, use of SSRI for symptom management . She states she has used lexapro in the past during her separation and had a very bad experience she declines any "of those types of medication" to help with her mood. Discussed changing BC to improve mood symptoms with possibility that it may change how it effects her cycle. Pt asked about ablation procedure, discussed that this does not allow for cyst suppression and that it may only last for bleeding control for a few yrs up to 10 yrs and given that she is 40 yrs old this may not be a long term option for treatment of her heavy periods.   Discussed IUD, she states she had in the past and had an horrible infection with it .  She is hesitant to change the pill she is on because her periods are so painful and heavy . Discussed giving It a few  more months with the same pill to see if she stabilizes or going to the Lo Loestrin pill. She request to give it a few months more on current pill. She will reach out to me if she desires to try Lo loestrin options.   Face to face time 19 min. Reviewing her history, discussing her symptoms, discussing SSRI for management, BC options , Ablation procedure, peri menopausal and hormonal changes, developing a plan of care.   24, CNM

## 2020-08-31 ENCOUNTER — Ambulatory Visit
Admission: EM | Admit: 2020-08-31 | Discharge: 2020-08-31 | Disposition: A | Discharge status: Home / Self Care | Payer: 59 | Attending: Emergency Medicine | Admitting: Emergency Medicine

## 2020-08-31 ENCOUNTER — Other Ambulatory Visit: Payer: Self-pay

## 2020-08-31 ENCOUNTER — Encounter: Payer: Self-pay | Admitting: Emergency Medicine

## 2020-08-31 DIAGNOSIS — N898 Other specified noninflammatory disorders of vagina: Secondary | ICD-10-CM | POA: Diagnosis not present

## 2020-08-31 DIAGNOSIS — N390 Urinary tract infection, site not specified: Secondary | ICD-10-CM | POA: Diagnosis not present

## 2020-08-31 LAB — POCT URINALYSIS DIP (MANUAL ENTRY)
Bilirubin, UA: NEGATIVE
Glucose, UA: NEGATIVE mg/dL
Ketones, POC UA: NEGATIVE mg/dL
Nitrite, UA: POSITIVE — AB
Protein Ur, POC: 30 mg/dL — AB
Spec Grav, UA: >=1.03 — AB
Urobilinogen, UA: 1 U/dL
pH, UA: 5.5

## 2020-08-31 LAB — POCT URINE PREGNANCY: Preg Test, Ur: NEGATIVE

## 2020-08-31 MED ORDER — PHENAZOPYRIDINE HCL 200 MG PO TABS: 200.0000 mg | ORAL_TABLET | Freq: Three times a day (TID) | ORAL | 0 refills | Status: AC

## 2020-08-31 MED ORDER — CEPHALEXIN 500 MG PO CAPS: 500.0000 mg | ORAL_CAPSULE | Freq: Two times a day (BID) | ORAL | 0 refills | Status: AC

## 2020-08-31 NOTE — ED Provider Notes (Signed)
Subjective:    Jordan Schwartz is a very pleasant 40 y.o. female who presents with concerns for UTI due to painful urination which started over the last couple of days along with some nausea.  Patient also reports having fishy thick white discharge.  Patient states that she has also had some lower abdominal discomfort.  Patient states she does have a standing prescription for MetroGel to use as needed.  Patient states that she has used AZO and MetroGel without any improvement in symptoms.  Patient states she has used ibuprofen which has helped somewhat. No unilateral back pain, fever.  Past medical history, past surgical history, current medications reviewed.  Allergies: is allergic to penicillins.  Review of Systems See HPI   Objective:     Vitals:   08/31/20 0855  BP: 111/75  Pulse: 81  Resp: 14  Temp: 98.3 F (36.8 C)  SpO2: 97%     General: Appears well-developed and well-nourished. No acute distress.  Cardiovascular: Normal rate  Pulm/Chest: No respiratory distress.  Neurological: Alert and oriented to person, place, and time.  Skin: Skin is warm and dry.  Psychiatric: Normal mood, affect, behavior, and thought content.  GU:  Deferred secondary to self collect specimen  Laboratory:  Orders Placed This Encounter  Procedures   Urine Culture   POCT urinalysis dipstick   POCT urine pregnancy   Results for orders placed or performed during the hospital encounter of 08/31/20  POCT urinalysis dipstick  Result Value Ref Range   Color, UA orange (A) yellow   Clarity, UA cloudy (A) clear   Glucose, UA negative negative mg/dL   Bilirubin, UA negative negative   Ketones, POC UA negative negative mg/dL   Spec Grav, UA >=4.967 (A) 1.010 - 1.025   Blood, UA moderate (A) negative   pH, UA 5.5 5.0 - 8.0   Protein Ur, POC =30 (A) negative mg/dL   Urobilinogen, UA 1.0 0.2 or 1.0 E.U./dL   Nitrite, UA Positive (A) Negative   Leukocytes, UA Small (1+) (A) Negative  POCT urine  pregnancy  Result Value Ref Range   Preg Test, Ur Negative Negative    Urinalysis reveals orange cloudy urine with increased specific gravity, moderate blood, 30 protein, positive nitrite and small leukocytes. Assessment:   1. Acute UTI - Urine Culture; Standing - Urine Culture  2. Vaginal discharge  Meds ordered this encounter  Medications   cephALEXin (KEFLEX) 500 MG capsule    Sig: Take 1 capsule (500 mg total) by mouth 2 (two) times daily for 7 days.    Dispense:  14 capsule    Refill:  0    Order Specific Question:   Supervising Provider    Answer:   Merrilee Jansky [[id number]]   phenazopyridine (PYRIDIUM) 200 MG tablet    Sig: Take 1 tablet (200 mg total) by mouth 3 (three) times daily for 2 days.    Dispense:  6 tablet    Refill:  0    Order Specific Question:   Supervising Provider    Answer:   Merrilee Jansky [id number]     Plan:   MDM: Patient presents with concerns for UTI due to painful urination which started over the last couple of days along with some nausea.  Patient also reports having fishy thick white discharge.  Patient states that she has also had some lower abdominal discomfort.  Patient states she does have a standing prescription for MetroGel to use as needed.  Patient states that she  has used AZO and MetroGel without any improvement in symptoms.  Patient states she has used ibuprofen which has helped somewhat. No unilateral back pain, fever.  Chart review completed.  Urinalysis reveals orange cloudy urine with increased [place] gravity, moderate blood, 30 protein, positive nitrite and small leukocytes.  Given urinalysis results, likely acute UTI.  Urine pregnancy test is negative.  Urine culture is pending.  A vaginal swab was completed in clinic today by the patient and I informed her that we will call with any positive results that require treatment, otherwise she will check her MyChart for up-to-date lab results.  Advised about home treatment and care as  outlined in her AVS.  Work note provided.  Rx Keflex and Pyridium to the patient's preferred pharmacy as a chart review of her last urine culture revealed strep.  Patient stable on discharge.  Return as needed.  Patient verbalized understanding and agreed with plan.    Discharge Instructions      Your testing has been sent to the lab.  If your testing results as positive, you will be notified via phone and we will initiate treatment at that time.  If you do not receive a phone call from [place] within the next 2 to 3 days, check your MyChart for updated health information. Do not engage in sex until you know your results.  You were seen today for an infection in the lower urinary tract. Your urine sample today was sent for a culture. The urine culture will show what type of bacteria grows and if you are on the appropriate antibiotic. If the antibiotic needs to be changed, you will receive a phone call from the follow up nurse who will give you more information. If you do not receive a call, then you are on the correct antibiotic.  Take antibiotics as directed. Finish course even if feeling better sooner. Drink plenty of clear fluids. Over the counter "Uristat" or "Azo Standard" may help your discomfort.  This will turn your urine dark orange or red and may stain contacts (remove before using). You may experience 24 - 48 hours of continuing discomfort until medication controls the infection.  Return to clinic or go to the ER if you develop a fever, one-sided back pain, or vomiting as these are signs of a worsening infection.        Amalia Greenhouse, FNP-C 08/31/20   A copy of these instructions was provided to the patient or responsible parent/guardian, who expressed understanding and agreed with the treatment plan.  All questions addressed.  This note was partially made with the aid of speech-to-text dictation; typographical errors are not intentional.    Amalia Greenhouse, FNP 08/31/20 1006

## 2020-08-31 NOTE — ED Triage Notes (Addendum)
Patient c/o Dysuria and Vaginal Discharge x 2 days.   Patient denies fever and back pain.   Patient endorses lower ABD pain.   Patient endorses "burning with urination".   Patient endorses a fishy, thick, white discharge.   Patient endorses chills.   Patient used AZO and Flagyl gel( "for 2 days") w/ no relief of symptoms.   Patient has taken Ibuprofen with some relief of symptoms.

## 2020-08-31 NOTE — Discharge Instructions (Addendum)
Your testing has been sent to the lab.  If your testing results as positive, you will be notified via phone and we will initiate treatment at that time.  If you do not receive a phone call from [place] within the next 2 to 3 days, check your MyChart for updated health information. Do not engage in sex until you know your results.  You were seen today for an infection in the lower urinary tract. Your urine sample today was sent for a culture. The urine culture will show what type of bacteria grows and if you are on the appropriate antibiotic. If the antibiotic needs to be changed, you will receive a phone call from the follow up nurse who will give you more information. If you do not receive a call, then you are on the correct antibiotic.  Take antibiotics as directed. Finish course even if feeling better sooner. Drink plenty of clear fluids. Over the counter "Uristat" or "Azo Standard" may help your discomfort.  This will turn your urine dark orange or red and may stain contacts (remove before using). You may experience 24 - 48 hours of continuing discomfort until medication controls the infection.  Return to clinic or go to the ER if you develop a fever, one-sided back pain, or vomiting as these are signs of a worsening infection.

## 2020-09-02 LAB — URINE CULTURE: Culture: 40000 — AB

## 2020-09-02 LAB — CERVICOVAGINAL ANCILLARY ONLY
Bacterial Vaginitis (gardnerella): NEGATIVE
Candida Glabrata: NEGATIVE
Candida Vaginitis: NEGATIVE
Chlamydia: NEGATIVE
Comment: NEGATIVE
Comment: NEGATIVE
Comment: NEGATIVE
Comment: NEGATIVE
Comment: NEGATIVE
Comment: NORMAL
Neisseria Gonorrhea: NEGATIVE
Trichomonas: NEGATIVE

## 2021-05-10 ENCOUNTER — Other Ambulatory Visit: Payer: Self-pay | Admitting: Physician Assistant

## 2021-05-10 DIAGNOSIS — R1032 Left lower quadrant pain: Secondary | ICD-10-CM

## 2021-05-16 ENCOUNTER — Ambulatory Visit
Admission: RE | Admit: 2021-05-16 | Discharge: 2021-05-16 | Disposition: A | Discharge status: Home / Self Care | Payer: 59 | Source: Ambulatory Visit | Attending: Physician Assistant | Admitting: Physician Assistant

## 2021-05-16 DIAGNOSIS — R1032 Left lower quadrant pain: Secondary | ICD-10-CM

## 2021-05-16 MED ORDER — GADOBENATE DIMEGLUMINE 529 MG/ML IV SOLN
12.0000 mL | Freq: Once | INTRAVENOUS | Status: AC | PRN
  Administered 2021-05-16: 12 mL via INTRAVENOUS

## 2021-11-09 ENCOUNTER — Other Ambulatory Visit: Payer: Self-pay | Admitting: Physician Assistant

## 2021-11-09 ENCOUNTER — Ambulatory Visit
Admission: RE | Admit: 2021-11-09 | Discharge: 2021-11-09 | Disposition: A | Discharge status: Home / Self Care | Payer: 59 | Source: Ambulatory Visit | Attending: Physician Assistant | Admitting: Physician Assistant

## 2021-11-09 DIAGNOSIS — R1032 Left lower quadrant pain: Secondary | ICD-10-CM

## 2021-11-09 DIAGNOSIS — K5900 Constipation, unspecified: Secondary | ICD-10-CM

## 2022-01-04 ENCOUNTER — Ambulatory Visit
Admission: RE | Admit: 2022-01-04 | Discharge: 2022-01-04 | Disposition: A | Discharge status: Home / Self Care | Payer: 59 | Source: Ambulatory Visit | Attending: Physician Assistant | Admitting: Physician Assistant

## 2022-01-04 ENCOUNTER — Other Ambulatory Visit: Payer: Self-pay | Admitting: Physician Assistant

## 2022-01-04 DIAGNOSIS — R1032 Left lower quadrant pain: Secondary | ICD-10-CM

## 2022-03-29 ENCOUNTER — Ambulatory Visit
Admission: EM | Admit: 2022-03-29 | Discharge: 2022-03-29 | Disposition: A | Discharge status: Home / Self Care | Payer: 59 | Attending: Physician Assistant | Admitting: Physician Assistant

## 2022-03-29 DIAGNOSIS — B3731 Acute candidiasis of vulva and vagina: Secondary | ICD-10-CM | POA: Insufficient documentation

## 2022-03-29 LAB — WET PREP, GENITAL
Clue Cells Wet Prep HPF POC: NONE SEEN
Sperm: NONE SEEN
Trich, Wet Prep: NONE SEEN
WBC, Wet Prep HPF POC: <10 (ref <10)

## 2022-03-29 MED ORDER — FLUCONAZOLE 150 MG PO TABS: 150.0000 mg | ORAL_TABLET | Freq: Every day | ORAL | 0 refills | Status: AC

## 2022-03-29 NOTE — ED Triage Notes (Signed)
Pt c/o vaginal discharge & pain ongoing x2 wks, concerned about BV. Hx of recurrent BV infection.

## 2022-03-29 NOTE — ED Provider Notes (Signed)
[facility]    CSN: [id number] Arrival date & time: 03/29/22  [id number]      History   Chief Complaint Chief Complaint  Patient presents with   Vaginal Problem    HPI Jordan Schwartz is a 42 y.o. female.   Patient presents for evaluation of a Jordan Schwartz thin vaginal discharge with intermittent mild odor present for 2 weeks.  Has been experiencing intermittent sharp left-sided pelvic pain.  History of reoccurring bacterial vaginosis, took a course of MetroGel which did improve smell.  Denies urinary symptoms, flank pain, fevers, new rash or lesions.  Sexually active, 1 partner, no known exposures.    Past Medical History:  Diagnosis Date   Bacterial vaginosis     Patient Active Problem List   Diagnosis Date Noted   Vaginal odor 11/17/2019    Past Surgical History:  Procedure Laterality Date   CESAREAN SECTION     NASAL SEPTUM SURGERY     TUBAL LIGATION      OB History     Gravida  2   Para  2   Term  2   Preterm      AB      Living  2      SAB      IAB      Ectopic      Multiple      Live Births  2            Home Medications    Prior to Admission medications   Medication Sig Start Date End Date Taking? Authorizing Provider  acetaminophen (TYLENOL) 500 MG tablet Take 1,000 mg by mouth every 6 (six) hours as needed for mild pain.   Yes [provider]  ibuprofen (ADVIL) 200 MG tablet Take 400 mg by mouth every 6 (six) hours as needed for moderate pain.   Yes [provider]  Multiple Vitamin (MULTI-VITAMIN) tablet Take 1 tablet by mouth every morning.   Yes [provider]  norethindrone-ethinyl estradiol-iron (LOESTRIN FE) 1.5-30 MG-MCG tablet Take 1 tablet by mouth daily. 11/03/18  Yes [provider]    Family History Family History  Problem Relation Age of Onset   Osteoporosis Mother    Hypertension Father    Diabetes Father     Social History Social History   Tobacco Use   Smoking  status: Never   Smokeless tobacco: Never  Substance Use Topics   Alcohol use: Yes    Comment: socially   Drug use: Never     Allergies   Penicillins   Review of Systems Review of Systems  Constitutional: Negative.   Respiratory: Negative.    Cardiovascular: Negative.   Gastrointestinal: Negative.   Genitourinary:  Positive for pelvic pain and vaginal discharge. Negative for decreased urine volume, difficulty urinating, dyspareunia, dysuria, enuresis, flank pain, frequency, genital sores, hematuria, menstrual problem, urgency, vaginal bleeding and vaginal pain.  Skin: Negative.   Neurological: Negative.      Physical Exam Triage Vital Signs ED Triage Vitals  Enc Vitals Group     BP 03/29/22 0833 128/85     Pulse Rate 03/29/22 0833 65     Resp 03/29/22 0833 16     Temp 03/29/22 0833 98.6 F (37 C)     Temp Source 03/29/22 0833 Oral     SpO2 03/29/22 0833 100 %     Weight 03/29/22 0832 135 lb (61.2 kg)     Height 03/29/22 0832 5' 8"$  (1.727 m)  Head Circumference --      Peak Flow --      Pain Score 03/29/22 0832 0     Pain Loc --      Pain Edu? --      Excl. in [place]? --    No data found.  Updated Vital Signs BP 128/85 (BP Location: Left Arm)   Pulse 65   Temp 98.6 F (37 C) (Oral)   Resp 16   Ht 5' 8"$  (1.727 m)   Wt 135 lb (61.2 kg)   SpO2 100%   BMI 20.53 kg/m   Visual Acuity Right Eye Distance:   Left Eye Distance:   Bilateral Distance:    Right Eye Near:   Left Eye Near:    Bilateral Near:     Physical Exam Constitutional:      Appearance: Normal appearance.  Eyes:     Extraocular Movements: Extraocular movements intact.  Pulmonary:     Effort: Pulmonary effort is normal.  Abdominal:     General: Abdomen is flat. Bowel sounds are normal.     Palpations: Abdomen is soft.  Neurological:     Mental Status: She is alert and oriented to person, place, and time. Mental status is at baseline.      UC Treatments / Results  Labs (all labs  ordered are listed, but only abnormal results are displayed) Labs Reviewed  WET PREP, GENITAL - Abnormal; Notable for the following components:      Result Value   Yeast Wet Prep HPF POC PRESENT (*)    All other components within normal limits  CERVICOVAGINAL ANCILLARY ONLY    EKG   Radiology No results found.  Procedures Procedures (including critical care time)  Medications Ordered in UC Medications - No data to display  Initial Impression / Assessment and Plan / UC Course  I have reviewed the triage vital signs and the nursing notes.  Pertinent labs & imaging results that were available during my care of the patient were reviewed by me and considered in my medical decision making (see chart for details).  Yeast vaginitis  Confirmed on wet prep, negative for trichomoniasis and BV, discussed with patient, Diflucan prescribed and discussed administration as well as additional supportive measures, gonorrhea and Chlamydia testing pending, advised abstinence until lab results, treatment is complete and all symptoms have resolved, may follow-up with his urgent care or gynecologist if symptoms return Final Clinical Impressions(s) / UC Diagnoses   Final diagnoses:  Yeast dermatitis     Discharge Instructions      Today you are being treated  for yeast.  Negative for BV, negative for trichomoniasis  Take diflucan 150 mg once, if symptoms still present in 3 days then you may take second pill   Yeast infections which are caused by a naturally occurring fungus called candida. Vaginosis is an inflammation of the vagina that can result in discharge, itching and pain. The cause is usually a change in the normal balance of vaginal bacteria or an infection. Vaginosis can also result from reduced estrogen levels after menopause.  Labs pending, you will be contacted if positive for any sti and treatment will be sent to the pharmacy, you will have to return to the clinic if positive for  gonorrhea to receive treatment   Please refrain from having sex until labs results, if positive please refrain from having sex until treatment complete and symptoms resolve   If positive for  Chlamydia  gonorrhea please notify partner or  partners so they may tested as well  Moving forward, it is recommended you use some form of protection against the transmission of sti infections  such as condoms or dental dams with each sexual encounter    In addition:   Avoid baths, hot tubs and whirlpool spas.  Don't use scented or harsh soaps, such as those with deodorant or antibacterial action. Avoid irritants. These include scented tampons and pads. Wipe from front to back after using the toilet.  Don't douche. Your vagina doesn't require cleansing other than normal bathing.  Use a  condom. Wear cotton underwear, this fabric helps absorb moisture     ED Prescriptions   None    PDMP not reviewed this encounter.   Hans Eden, [place] 03/29/22 [phone]

## 2022-03-29 NOTE — Discharge Instructions (Addendum)
Today you are being treated  for yeast.  Negative for BV, negative for trichomoniasis  Take diflucan 150 mg once, if symptoms still present in 3 days then you may take second pill   Yeast infections which are caused by a naturally occurring fungus called candida. Vaginosis is an inflammation of the vagina that can result in discharge, itching and pain. The cause is usually a change in the normal balance of vaginal bacteria or an infection. Vaginosis can also result from reduced estrogen levels after menopause.  Labs pending, you will be contacted if positive for any sti and treatment will be sent to the pharmacy, you will have to return to the clinic if positive for gonorrhea to receive treatment   Please refrain from having sex until labs results, if positive please refrain from having sex until treatment complete and symptoms resolve   If positive for  Chlamydia  gonorrhea please notify partner or partners so they may tested as well  In addition:   Avoid baths, hot tubs and whirlpool spas.  Don't use scented or harsh soaps, such as those with deodorant or antibacterial action. Avoid irritants. These include scented tampons and pads. Wipe from front to back after using the toilet.  Don't douche. Your vagina doesn't require cleansing other than normal bathing.  Use a  condom. Wear cotton underwear, this fabric helps absorb moisture

## 2022-03-30 LAB — CERVICOVAGINAL ANCILLARY ONLY
Chlamydia: NEGATIVE
Comment: NEGATIVE
Comment: NEGATIVE
Comment: NORMAL
Neisseria Gonorrhea: NEGATIVE
Trichomonas: NEGATIVE

## 2022-04-27 IMAGING — CT CT ABD-PELV W/ CM
2 of 3 series · 13 of 36 positions shown, 18 images · IV contrast (Omnipaque)
Comparison: None.

CLINICAL DATA: Right lower quadrant abdominal pain for 3 days

EXAM:
CT ABDOMEN AND PELVIS WITH CONTRAST
TECHNIQUE: Multidetector CT imaging of the abdomen and pelvis was performed
using the standard protocol following bolus administration of
intravenous contrast.
CONTRAST:  100mL OMNIPAQUE IOHEXOL 300 MG/ML  SOLN

[Series 2: axial st · axial · 0.85mm/px · z∈[-466,-76]mm · 12 of 89 slices shown, 16 images]
[im 7/89  soft-tissue]
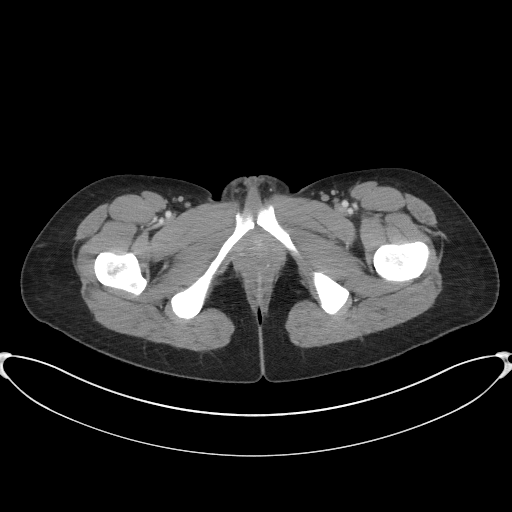
[im 7/89  bone]
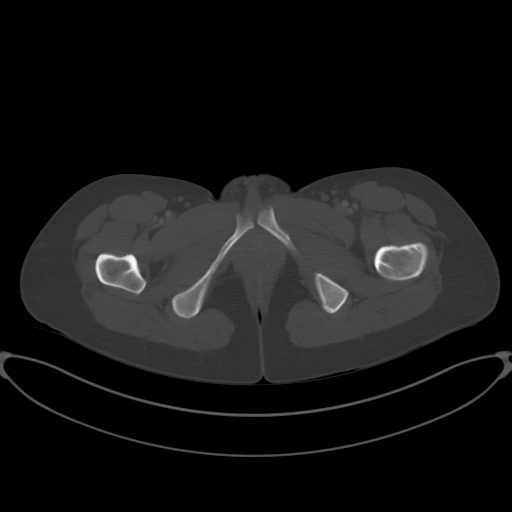
[im 14/89  soft-tissue]
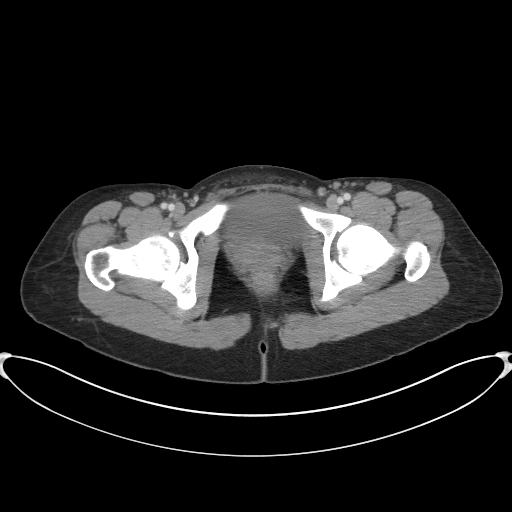
[im 23/89  soft-tissue]
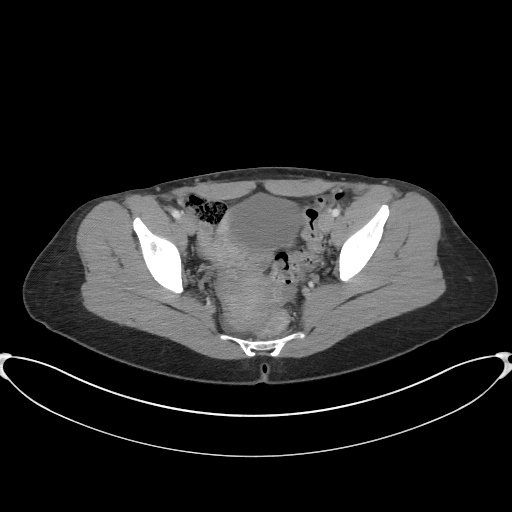
[im 33/89  soft-tissue]
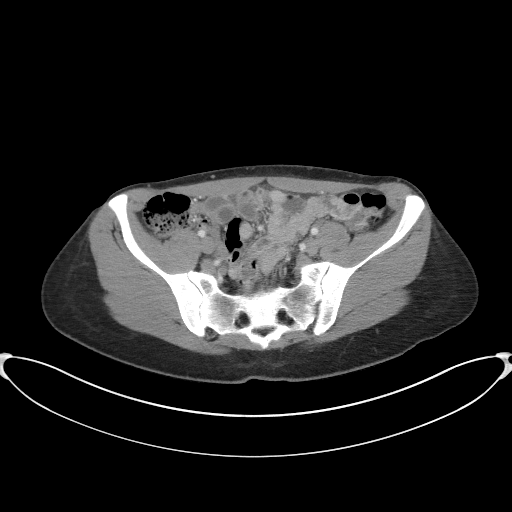
[im 40/89  soft-tissue]
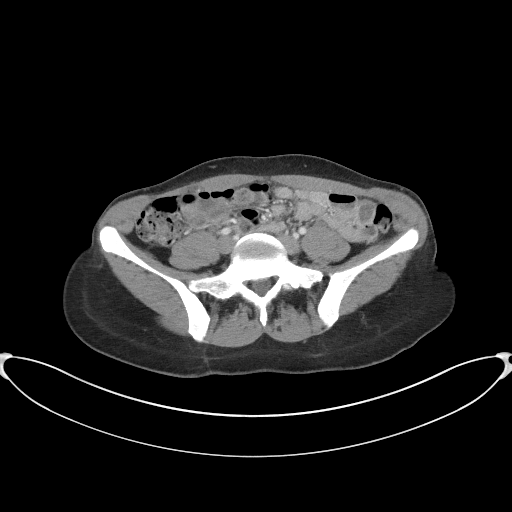
[im 49/89  soft-tissue]
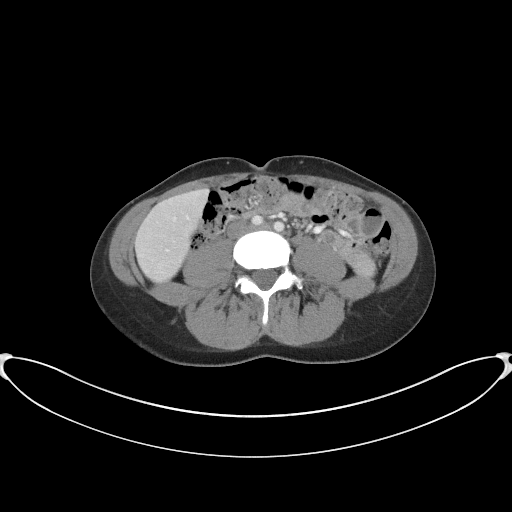
[im 56/89  soft-tissue]
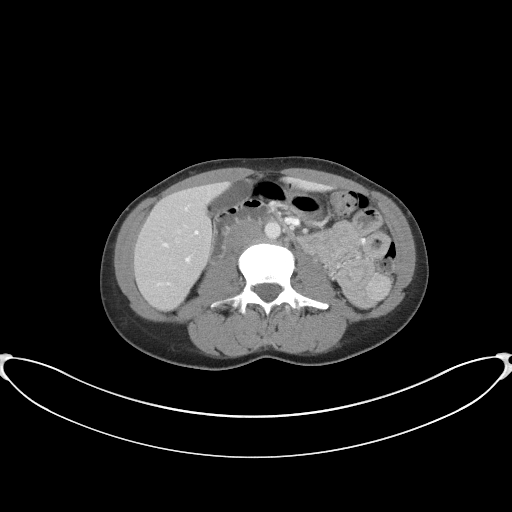
[im 66/89  soft-tissue]
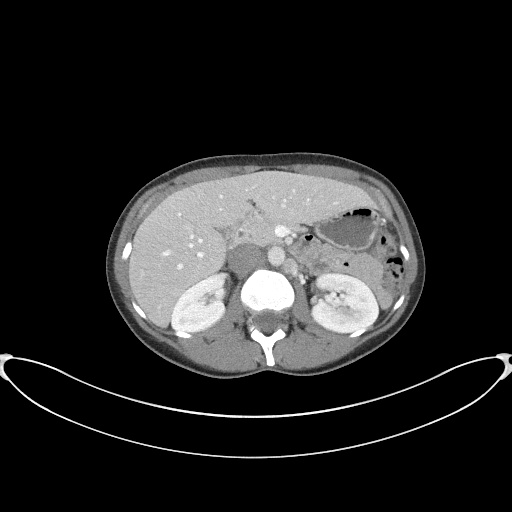
[im 75/89  soft-tissue]
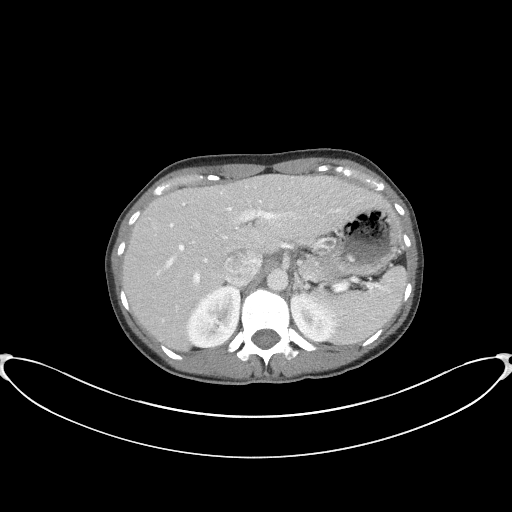
[im 75/89  lung]
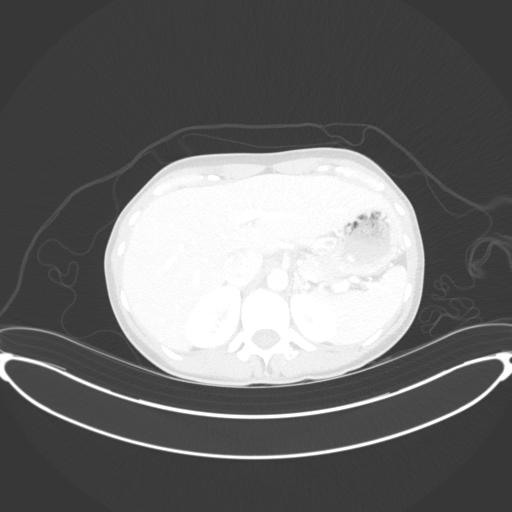
[im 75/89  bone]
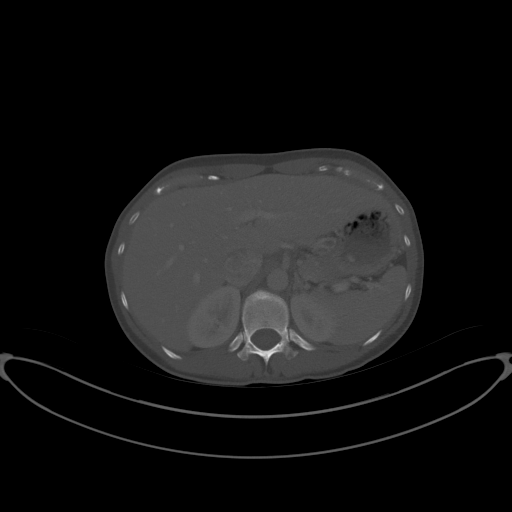
[im 79/89  lung]
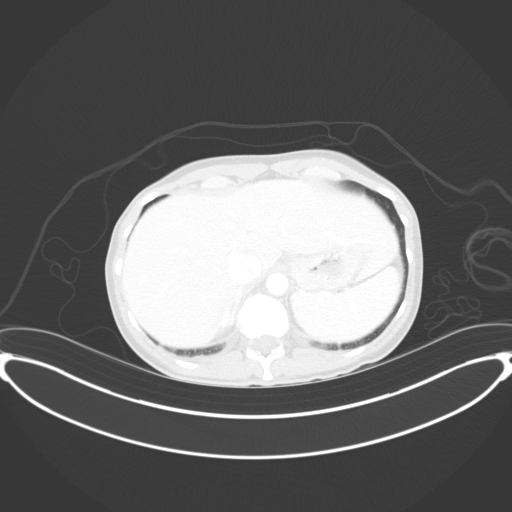
[im 82/89  soft-tissue]
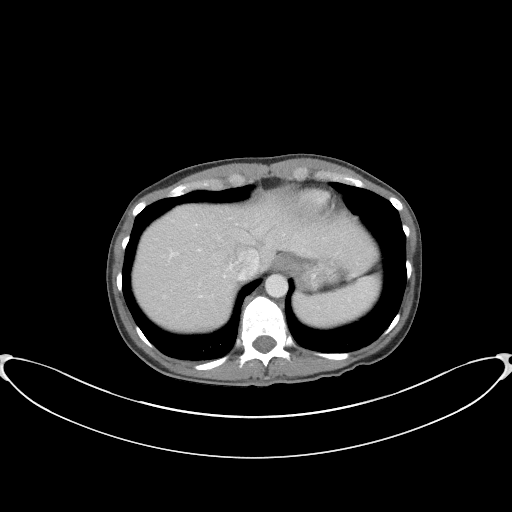
[im 82/89  lung]
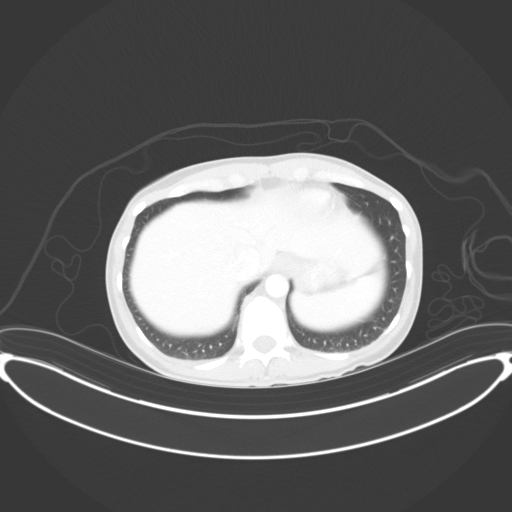
[im 85/89  lung]
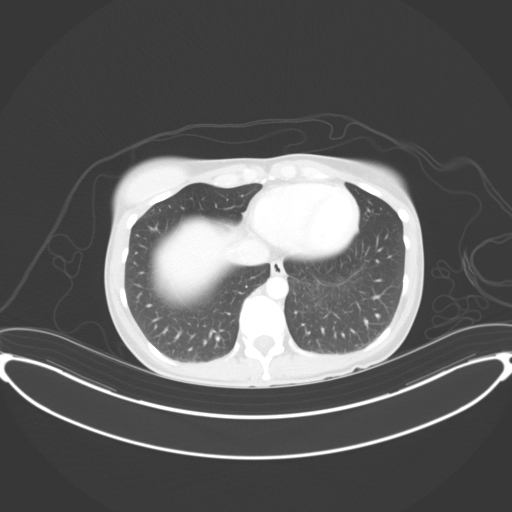

[Series 6: sagittal st · sagittal · 0.55mm/px · 1 of 104 slices shown, 2 images]
[im 35/104  soft-tissue]
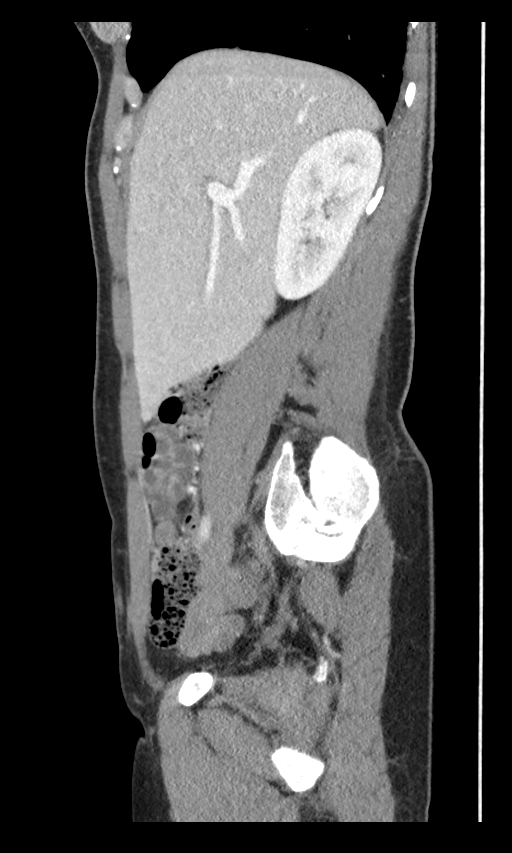
[im 35/104  bone]
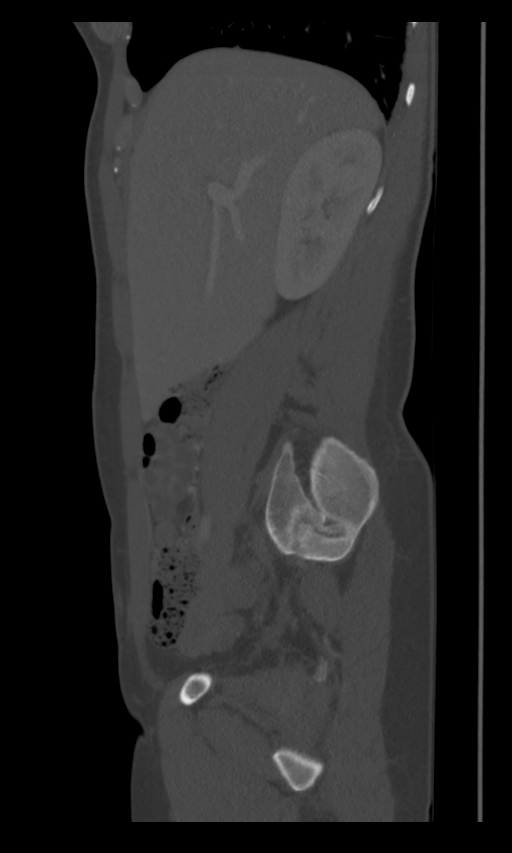

[13 of 36 positions shown; findings below may reference images not displayed]

FINDINGS: Lower chest: No acute pleural or parenchymal lung disease.

Hepatobiliary: No focal liver abnormality is seen. No gallstones,
gallbladder wall thickening, or biliary dilatation.

Pancreas: Unremarkable. No pancreatic ductal dilatation or
surrounding inflammatory changes.

Spleen: Normal in size without focal abnormality.

Adrenals/Urinary Tract: Adrenal glands are unremarkable. Kidneys are
normal, without renal calculi, focal lesion, or hydronephrosis.
Bladder is unremarkable.

Stomach/Bowel: No bowel obstruction or ileus. Normal gas-filled
appendix right lower quadrant. No bowel wall thickening or
inflammatory change.

Vascular/Lymphatic: No significant vascular findings are present. No
enlarged abdominal or pelvic lymph nodes.

Reproductive: Uterus and bilateral adnexa are unremarkable.

Other: Trace pelvic free fluid is likely physiologic. No free
intraperitoneal gas. No abdominal wall hernia.

Musculoskeletal: No acute or destructive bony lesions. Reconstructed
images demonstrate no additional findings.
IMPRESSION: 1. No acute intra-abdominal process.  Normal appendix.
2. Trace pelvic free fluid, likely physiologic.

## 2022-09-14 ENCOUNTER — Telehealth: Payer: Self-pay

## 2022-09-14 NOTE — Telephone Encounter (Signed)
Patient has referral in the system for Colonoscopy. Contacted her to discuss referral.  She explained- she had a colonoscopy in Feb. She is looking to transfer GI Care.  She has specifically requested Dr. Servando Snare.  She was not pleased with the care she received in the past, and would like to go somewhere locally.  She said she suspects she has IBS-Constipation.   Thanks, [place], [place]

## 2022-11-01 ENCOUNTER — Encounter: Payer: Self-pay | Admitting: Gastroenterology

## 2022-11-01 ENCOUNTER — Ambulatory Visit: Payer: 59 | Admitting: Gastroenterology

## 2022-11-01 VITALS — BP 127/88 | HR 76 | Temp 98.4°F | Ht 68.0 in | Wt 139.0 lb

## 2022-11-01 DIAGNOSIS — K581 Irritable bowel syndrome with constipation: Secondary | ICD-10-CM | POA: Diagnosis not present

## 2022-11-01 MED ORDER — DESIPRAMINE HCL 25 MG PO TABS: 25.0000 mg | ORAL_TABLET | Freq: Every day | ORAL | 2 refills | Status: DC

## 2022-11-01 NOTE — Progress Notes (Signed)
Gastroenterology Consultation  Referring Provider:     Etheleen Nicks, NP Primary Care Physician:  Etheleen Nicks, NP Primary Gastroenterologist:  Dr. Servando Snare     Reason for Consultation:     Irritable bowel syndrome        HPI:   Jordan Schwartz is a 42 y.o. y/o female referred for consultation & management of irritable bowel syndrome by Dr. Etheleen Nicks, NP.  This patient comes in today after being seen in the past at [facility] for irritable bowel syndrome with constipation predominance.  The patient states that she had tried Trulance in the past and that caused her to have a lot of abdominal discomfort in the left lower quadrant.  She has been on 72 mcg of Linzess and reports that this has caused her to have diarrhea but her symptoms are greatly improved.  The patient reports that she has heard of a low FODMAP diet but has not been strictly adhering to it.  The patient denies any unexplained weight loss fevers chills nausea vomiting black stools or bloody stools.  The patient does report that she had a colonoscopy earlier this year and reports that it was clean without any polyps.  She is now transferring care to [place] because she was not getting better with that management.  The patient denies ever being put on a tricyclic antidepressant before nor has she adhere to a low FODMAP diet.  Past Medical History:  Diagnosis Date   Bacterial vaginosis     Past Surgical History:  Procedure Laterality Date   CESAREAN SECTION     NASAL SEPTUM SURGERY     TUBAL LIGATION      Prior to Admission medications   Medication Sig Start Date End Date Taking? Authorizing Provider  acetaminophen (TYLENOL) 500 MG tablet Take 1,000 mg by mouth every 6 (six) hours as needed for mild pain.   Yes [provider]  ibuprofen (ADVIL) 200 MG tablet Take 400 mg by mouth every 6 (six) hours as needed for moderate pain.   Yes [provider]  linaclotide (LINZESS) 72 MCG capsule TAKE 1 CAPSULE  ORALLY AT LEAST . BEFORE THE FIRST MEAL OF THE DAY ON AN EMPTY STOMACH X90 DAYS 03/05/22  Yes [provider]  Multiple Vitamin (MULTI-VITAMIN) tablet Take 1 tablet by mouth every morning.   Yes [provider]  norethindrone-ethinyl estradiol-iron (LOESTRIN FE) 1.5-30 MG-MCG tablet Take 1 tablet by mouth daily. 11/03/18  Yes [provider]  spironolactone (ALDACTONE) 25 MG tablet Take 1 tablet by mouth 2 (two) times daily.   Yes [provider]    Family History  Problem Relation Age of Onset   Osteoporosis Mother    Hypertension Father    Diabetes Father      Social History   Tobacco Use   Smoking status: Never   Smokeless tobacco: Never  Substance Use Topics   Alcohol use: Yes    Comment: socially   Drug use: Never    Allergies as of 11/01/2022 - Review Complete 11/01/2022  Allergen Reaction Noted   Penicillins Rash 06/23/2013    Review of Systems:    All systems reviewed and negative except where noted in HPI.   Physical Exam:  BP 127/88 (BP Location: Right Arm, Patient Position: Sitting, Cuff Size: Normal)   Pulse 76   Temp 98.4 F (36.9 C) (Oral)   Ht 5\' 8"  (1.727 m)   Wt 139 lb (63 kg)   BMI 21.13 kg/m  No LMP recorded. General:   Alert,  Well-developed, well-nourished, pleasant and cooperative in NAD Head:  Normocephalic and atraumatic. Eyes:  Sclera clear, no icterus.   Conjunctiva pink. Ears:  Normal auditory acuity. Neck:  Supple; no masses or thyromegaly. Lungs:  Respirations even and unlabored.  Clear throughout to auscultation.   No wheezes, crackles, or rhonchi. No acute distress. Heart:  Regular rate and rhythm; no murmurs, clicks, rubs, or gallops. Abdomen:  Normal bowel sounds.  No bruits.  Soft, non-tender and non-distended without masses, hepatosplenomegaly or hernias noted.  No guarding or rebound tenderness.  Negative Carnett sign.   Rectal:  Deferred.  Pulses:  Normal pulses noted. Extremities:  No  clubbing or edema.  No cyanosis. Neurologic:  Alert and oriented x3;  grossly normal neurologically. Skin:  Intact without significant lesions or rashes.  No jaundice. Lymph Nodes:  No significant cervical adenopathy. Psych:  Alert and cooperative. Normal mood and affect.  Imaging Studies: No results found.  Assessment and Plan:   Jordan Schwartz is a 42 y.o. y/o female who comes in today with a history of IBS-C being treated with Linzess 72 mcg with resulting diarrhea.  She has tried Amitiza in the past and Trulance without benefit.  The patient will be started on desipramine 25 mg nightly and try that for a few weeks and see if it is improving her symptoms.  If it does not we can go up on the dose.  The patient has also been told to adhere to a low FODMAP diet.  In addition to this the patient has been told to start a daily fiber supplementation to see if her stools can become more solid.  She has also been told to not take the Linzess too close to meals because this could exacerbate the diarrhea.  She has been told that if her symptoms do not improve we may try Ibsrela for her constipation.  The patient has been explained the plan and agrees with it.    Midge Minium, MD. Clementeen Graham    Note: This dictation was prepared with Dragon dictation along with smaller phrase technology. Any transcriptional errors that result from this process are unintentional.

## 2022-11-05 ENCOUNTER — Encounter: Payer: Self-pay | Admitting: Gastroenterology

## 2022-11-13 ENCOUNTER — Encounter: Payer: Self-pay | Admitting: Gastroenterology

## 2022-11-25 ENCOUNTER — Other Ambulatory Visit: Payer: Self-pay | Admitting: Gastroenterology

## 2022-12-04 ENCOUNTER — Encounter: Payer: Self-pay | Admitting: Gastroenterology

## 2022-12-04 MED ORDER — LINACLOTIDE 72 MCG PO CAPS: 72.0000 ug | ORAL_CAPSULE | Freq: Every day | ORAL | 2 refills | Status: DC

## 2022-12-04 NOTE — Telephone Encounter (Signed)
Please advise if okay to refill Linzess 

## 2022-12-07 ENCOUNTER — Telehealth: Payer: Self-pay

## 2022-12-07 NOTE — Telephone Encounter (Signed)
Request Reference Number: [id number]. LINZESS CAP is approved through 12/07/2023.

## 2022-12-07 NOTE — Telephone Encounter (Signed)
Prior auth submitted via [email or web address], awaiting response... Pt notified

## 2023-03-18 ENCOUNTER — Encounter: Payer: Self-pay | Admitting: Gastroenterology

## 2023-04-16 ENCOUNTER — Telehealth: Payer: Self-pay

## 2023-04-16 ENCOUNTER — Telehealth: Payer: Self-pay | Admitting: Gastroenterology

## 2023-04-16 NOTE — Telephone Encounter (Signed)
 Patient called office -she is having sharp pain -she was on dicyclomine last time  this episode -denies fever and chills- she is having nausea and pain is worsening- bowel movements-small amount- once daily- not taking Miralax-she stated she wanted to make sure Miralx could be used daily if needed.Looking back in her chart she sees Dr.Wohl for constipation.

## 2023-04-16 NOTE — Telephone Encounter (Addendum)
 This is a Dr Servando Snare patient. A telephone note from [facility] was sent to [facility].  There is nothing in the progress notes that shows she supposed to get a colonoscopy unless I am missing it but she is only 42 so it would not be screening anyway's.

## 2023-04-16 NOTE — Telephone Encounter (Signed)
 PT requesting call back to schedule colonoscopy

## 2023-04-16 NOTE — Telephone Encounter (Signed)
 The patient called and left a voicemail regarding complications she is experiencing and asked if she needs to schedule an appointment. I returned her call to confirm receipt of her message and inquired whether she is an established or new patient. As the patient did not answer the phone, I left a detailed message on her voicemail. I also informed her that I forwarded her message to the nurse and advised her to call us back.

## 2023-04-16 NOTE — Telephone Encounter (Signed)
 okay

## 2023-04-26 NOTE — Telephone Encounter (Signed)
 See other phone note, it looks like Velna Hatchet forwarded to [employer]

## 2023-05-07 ENCOUNTER — Ambulatory Visit: Payer: 59 | Admitting: Family Medicine

## 2023-06-04 ENCOUNTER — Encounter: Payer: Self-pay | Admitting: Gastroenterology

## 2023-06-10 ENCOUNTER — Other Ambulatory Visit: Payer: Self-pay

## 2023-06-11 ENCOUNTER — Encounter: Payer: Self-pay | Admitting: Gastroenterology

## 2023-06-11 ENCOUNTER — Ambulatory Visit: Admitting: Gastroenterology

## 2023-06-11 VITALS — BP 132/84 | HR 89 | Temp 98.3°F | Ht 68.0 in | Wt 143.1 lb

## 2023-06-11 DIAGNOSIS — R140 Abdominal distension (gaseous): Secondary | ICD-10-CM

## 2023-06-11 DIAGNOSIS — R195 Other fecal abnormalities: Secondary | ICD-10-CM | POA: Diagnosis not present

## 2023-06-11 DIAGNOSIS — R198 Other specified symptoms and signs involving the digestive system and abdomen: Secondary | ICD-10-CM

## 2023-06-11 DIAGNOSIS — R1013 Epigastric pain: Secondary | ICD-10-CM

## 2023-06-11 DIAGNOSIS — R150 Incomplete defecation: Secondary | ICD-10-CM

## 2023-06-11 DIAGNOSIS — R1010 Upper abdominal pain, unspecified: Secondary | ICD-10-CM

## 2023-06-11 NOTE — Patient Instructions (Addendum)
 Placed referral to [facility] if they do not contact you in 2 to 3 weeks you can call them at [phone]

## 2023-06-11 NOTE — Progress Notes (Unsigned)
 Karma Oz, MD [address]  [address]  [place], [place] [address]  Main: [phone]  Fax: [phone]    Gastroenterology Consultation  Referring Provider:     Laurence Pons, NP Primary Care Physician:  Laurence Pons, NP Primary Gastroenterologist:  Dr. Karma Oz Reason for Consultation:     ***        HPI:   Jordan Schwartz is a 43 y.o. female referred by Dr. Laurence Pons, NP  for consultation & management of ***  NSAIDs: ***  Antiplts/Anticoagulants/Anti thrombotics: ***  GI Procedures: ***  Past Medical History:  Diagnosis Date   Bacterial vaginosis     Past Surgical History:  Procedure Laterality Date   CESAREAN SECTION     NASAL SEPTUM SURGERY     TUBAL LIGATION      Prior to Admission medications   Medication Sig Start Date End Date Taking? Authorizing Provider  JUNEL FE 1/20 1-20 MG-MCG tablet Take 1 tablet by mouth daily.   Yes [provider]  linaclotide  (LINZESS ) 72 MCG capsule Take 1 capsule (72 mcg total) by mouth daily before breakfast. 12/04/22  Yes Marnee Sink, MD  Multiple Vitamin (MULTI-VITAMIN) tablet Take 1 tablet by mouth every morning.   Yes [provider]  polyethylene glycol (MIRALAX / GLYCOLAX) 17 g packet Take 17 g by mouth daily.   Yes [provider]  spironolactone (ALDACTONE) 25 MG tablet Take 1 tablet by mouth 2 (two) times daily.   Yes [provider]    Family History  Problem Relation Age of Onset   Osteoporosis Mother    Hypertension Father    Diabetes Father      Social History   Tobacco Use   Smoking status: Never   Smokeless tobacco: Never  Substance Use Topics   Alcohol use: Yes    Comment: socially 3 times a week   Drug use: Never    Allergies as of 06/11/2023 - Review Complete 06/11/2023  Allergen Reaction Noted   Desipramine  Nausea Only and Other (See Comments) 11/26/2022   Penicillins Rash 06/23/2013    Review of Systems:    All  systems reviewed and negative except where noted in HPI.   Physical Exam:  BP 132/84 (BP Location: Left Arm, Patient Position: Sitting, Cuff Size: Normal)   Pulse 89   Temp 98.3 F (36.8 C) (Oral)   Ht 5\' 8"  (1.727 m)   Wt 143 lb 2 oz (64.9 kg)   BMI 21.76 kg/m  No LMP recorded.  General:   Alert,  Well-developed, well-nourished, pleasant and cooperative in NAD Head:  Normocephalic and atraumatic. Eyes:  Sclera clear, no icterus.   Conjunctiva pink. Ears:  Normal auditory acuity. Nose:  No deformity, discharge, or lesions. Mouth:  No deformity or lesions,oropharynx pink & moist. Neck:  Supple; no masses or thyromegaly. Lungs:  Respirations even and unlabored.  Clear throughout to auscultation.   No wheezes, crackles, or rhonchi. No acute distress. Heart:  Regular rate and rhythm; no murmurs, clicks, rubs, or gallops. Abdomen:  Normal bowel sounds. Soft, non-tender and non-distended without masses, hepatosplenomegaly or hernias noted.  No guarding or rebound tenderness.   Rectal: Not performed Msk:  Symmetrical without gross deformities. Good, equal movement & strength bilaterally. Pulses:  Normal pulses noted. Extremities:  No clubbing or edema.  No cyanosis. Neurologic:  Alert and oriented x3;  grossly normal neurologically. Skin:  Intact without significant lesions or rashes. No jaundice. Lymph  Nodes:  No significant cervical adenopathy. Psych:  Alert and cooperative. Normal mood and affect.  Imaging Studies: ***  Assessment and Plan:   Jordan Schwartz is a 43 y.o. female with ***   Follow up in ***   Karma Oz, MD

## 2023-06-12 ENCOUNTER — Telehealth: Payer: Self-pay

## 2023-06-12 NOTE — Telephone Encounter (Signed)
 Placed referral to [facility] for Anorectal manometry. Faxed to [phone]. Faxed with referral demographics, insurance, medication list,  and last office visit. Phone number to the office is [phone]

## 2023-06-13 ENCOUNTER — Encounter: Payer: Self-pay | Admitting: Gastroenterology

## 2023-06-13 ENCOUNTER — Telehealth: Payer: Self-pay

## 2023-06-13 LAB — THYROID PANEL
Free Thyroxine Index: 2.1 (ref 1.2–4.9)
T3 Uptake Ratio: 28 % (ref 24–39)
T4, Total: 7.5 ug/dL (ref 4.5–12.0)

## 2023-06-13 LAB — CELIAC DISEASE PANEL
Endomysial IgA: NEGATIVE
IgA/Immunoglobulin A, Serum: 356 mg/dL — ABNORMAL HIGH (ref 87–352)

## 2023-06-13 LAB — H. PYLORI BREATH TEST

## 2023-06-13 MED ORDER — LINACLOTIDE 145 MCG PO CAPS: 145.0000 ug | ORAL_CAPSULE | Freq: Every day | ORAL | 3 refills | Status: AC

## 2023-06-13 NOTE — Addendum Note (Signed)
 Addended by: Lovie Rudder on: 06/13/2023 11:29 AM   Modules accepted: Orders

## 2023-06-13 NOTE — Telephone Encounter (Signed)
 Request Reference Number: [id number]. LINZESS  CAP is approved through 06/12/2024. Your patient may now fill this prescription and it will be covered.

## 2023-06-13 NOTE — Telephone Encounter (Signed)
 Submitted PA for Linzess  through cover my meds. Waiting on response from insurance company

## 2023-06-25 ENCOUNTER — Ambulatory Visit: Admitting: Family Medicine

## 2023-06-25 VITALS — BP 114/69 | HR 77 | Temp 98.8°F | Ht 68.0 in | Wt 144.4 lb

## 2023-06-25 DIAGNOSIS — Z0001 Encounter for general adult medical examination with abnormal findings: Secondary | ICD-10-CM

## 2023-06-25 DIAGNOSIS — R7989 Other specified abnormal findings of blood chemistry: Secondary | ICD-10-CM | POA: Diagnosis not present

## 2023-06-25 DIAGNOSIS — R202 Paresthesia of skin: Secondary | ICD-10-CM

## 2023-06-25 DIAGNOSIS — K582 Mixed irritable bowel syndrome: Secondary | ICD-10-CM | POA: Diagnosis not present

## 2023-06-25 DIAGNOSIS — L700 Acne vulgaris: Secondary | ICD-10-CM | POA: Diagnosis not present

## 2023-06-25 DIAGNOSIS — Z23 Encounter for immunization: Secondary | ICD-10-CM | POA: Diagnosis not present

## 2023-06-25 DIAGNOSIS — Z13220 Encounter for screening for lipoid disorders: Secondary | ICD-10-CM

## 2023-06-25 DIAGNOSIS — Z0000 Encounter for general adult medical examination without abnormal findings: Secondary | ICD-10-CM

## 2023-06-25 DIAGNOSIS — Z130 Encounter for screening for diseases of the blood and blood-forming organs and certain disorders involving the immune mechanism: Secondary | ICD-10-CM

## 2023-06-25 MED ORDER — TRETINOIN 0.05 % EX CREA: TOPICAL_CREAM | Freq: Every day | CUTANEOUS | 1 refills | Status: AC

## 2023-06-25 MED ORDER — SPIRONOLACTONE 25 MG PO TABS: 25.0000 mg | ORAL_TABLET | Freq: Two times a day (BID) | ORAL | 3 refills | Status: DC

## 2023-06-25 NOTE — Progress Notes (Signed)
 New patient visit   Patient: Jordan Schwartz   DOB: 11/12/1980   43 y.o. Female  MRN: [id number] Visit Date: 06/25/2023  Today's healthcare provider: Carlean Charter, DO   Chief Complaint  Patient presents with   New Patient (Initial Visit)    Patient is present to become an established patient, she is currently having stomach pain, acid reflux, vomiting, bloating, and extended/hard stomach that protrudes out.  Diagnosed with IBS-C but is concerned of being misdiagnosed. Had been referred to a GI doctor and have not received any answers.  Was concerned if it could be something else that is stomach related.  Having a antorectal manometry on May 30th.  Wanting a refill on her medications Spironolactone  25 mg and Tretinoin - as needed.   Care Management    Tetanus Vaccine - Patient consented to Heather,SCMA to administering vaccine while being observed by myself Norvin Bees, CMA)   Subjective    Jordan Schwartz is a 43 y.o. female who presents today as a new patient to establish care.  HPI HPI     New Patient (Initial Visit)    Additional comments: Patient is present to become an established patient, she is currently having stomach pain, acid reflux, vomiting, bloating, and extended/hard stomach that protrudes out.  Diagnosed with IBS-C but is concerned of being misdiagnosed. Had been referred to a GI doctor and have not received any answers.  Was concerned if it could be something else that is stomach related.  Having a antorectal manometry on May 30th.  Wanting a refill on her medications Spironolactone  25 mg and Tretinoin - as needed.        Care Management    Additional comments: Tetanus Vaccine - Patient consented to [facility] to administering vaccine while being observed by myself Norvin Bees, CMA)      Last edited by Pasty Bongo, CMA on 06/25/2023  3:25 PM.       Jordan Schwartz is a 43 year old female with irritable bowel syndrome who presents with worsening  gastrointestinal symptoms.  She has a history of irritable bowel syndrome diagnosed by a GI specialist. Her symptoms have worsened over time despite various treatments. She has tried multiple medications including dicyclomine for pain, Trulance, Linzess , probiotics, peppermint oil capsules, and fiber supplements. Trulance caused severe pain and cramps, leading to its discontinuation. Linzess  was increased to 145 mcg due to persistent constipation, but she experiences variable stool consistency, ranging from watery to normal, depending on meal timing. Despite daily bowel movements, she feels incomplete evacuation and often sits for extended periods to achieve relief. She describes a constant sensation of needing to defecate without satisfaction.  Her symptoms began in 2021 with left-sided lower abdominal pain, initially suspected to be related to ovarian cysts. After an ultrasound and CT scan, she was told she was constipated and advised to use Miralax. She has also been evaluated for pelvic congestion syndrome. She has increased her water intake and follows a low FODMAP diet, avoiding foods like broccoli, cauliflower, garlic, and onion. Despite these efforts, she reports worsening symptoms, including bloating, acid reflux, and occasional vomiting, particularly after meals. She previously had an unremarkable colonoscopy.  She experiences paresthesia in her feet and toes, described as 'hot feet' and tingling, especially at night. She has a history of taking spironolactone  for acne and uses a topical retinoid cream. She is on oral birth control. She reports numbness and tingling in her feet and toes, which she describes as feeling like they 'fell asleep'. She  no longer takes B12 supplements.  She reports a significant stressor with the recent passing of her father, although she does not feel it is affecting her mental health. She has previously tried a tricyclic antidepressant, desipramine , for IBS-related  nerve pain but experienced severe side effects, including nausea and feeling 'like I was on drugs'. She has a history of a similar reaction to an SSRI during a past divorce. No anxiety or depression but acknowledges the overwhelming nature of recent events.  Currently, she is planned with GI for an anorectal manometry (pelvic floor dysfunction test).    Past Medical History:  Diagnosis Date   Bacterial vaginosis    GERD (gastroesophageal reflux disease) 02/2023   Due to IBS   Heart murmur    Past Surgical History:  Procedure Laterality Date   CESAREAN SECTION     COSMETIC SURGERY  07/2015   Breast Augmentation   NASAL SEPTUM SURGERY     TUBAL LIGATION     Family Status  Relation Name Status   Mother Ginny Lair Alive   Father Laurell Pond Alive   Sister Glenora Laos Snapp Alive  No partnership data on file   Family History  Problem Relation Age of Onset   Osteoporosis Mother    Alcohol abuse Mother    Hypertension Father    Diabetes Father    Arthritis Father    Heart disease Father    Kidney disease Father    Cancer Sister    Social History   Socioeconomic History   Marital status: Media planner    Spouse name: Not on file   Number of children: Not on file   Years of education: Not on file   Highest education level: Bachelor's degree (e.g., BA, AB, BS)  Occupational History   Not on file  Tobacco Use   Smoking status: Never   Smokeless tobacco: Never  Substance and Sexual Activity   Alcohol use: Yes    Alcohol/week: 2.0 standard drinks of alcohol    Types: 1 Glasses of wine, 1 Cans of beer per week    Comment: socially 3 times a week   Drug use: Never   Sexual activity: Yes    Birth control/protection: Pill, Surgical  Other Topics Concern   Not on file  Social History Narrative   Not on file   Social Drivers of Health   Financial Resource Strain: Low Risk  (06/25/2023)   Overall Financial Resource Strain (CARDIA)    Difficulty of Paying Living  Expenses: Not hard at all  Food Insecurity: No Food Insecurity (06/25/2023)   Hunger Vital Sign    Worried About Running Out of Food in the Last Year: Never true    Ran Out of Food in the Last Year: Never true  Transportation Needs: No Transportation Needs (06/25/2023)   PRAPARE - Administrator, Civil Service (Medical): No    Lack of Transportation (Non-Medical): No  Physical Activity: Sufficiently Active (06/25/2023)   Exercise Vital Sign    Days of Exercise per Week: 4 days    Minutes of Exercise per Session: 40 min  Stress: No Stress Concern Present (06/25/2023)   [employer] of Occupational Health - Occupational Stress Questionnaire    Feeling of Stress : Only a little  Social Connections: Moderately Isolated (06/25/2023)   Social Connection and Isolation Panel [NHANES]    Frequency of Communication with Friends and Family: Once a week    Frequency of Social Gatherings with Friends and Family: Once  a week    Attends Religious Services: More than 4 times per year    Active Member of Clubs or Organizations: No    Attends Banker Meetings: Not on file    Marital Status: Living with partner   Outpatient Medications Prior to Visit  Medication Sig   JUNEL FE 1/20 1-20 MG-MCG tablet Take 1 tablet by mouth daily.   linaclotide  (LINZESS ) 145 MCG CAPS capsule Take 1 capsule (145 mcg total) by mouth daily before breakfast.   Multiple Vitamin (MULTI-VITAMIN) tablet Take 1 tablet by mouth every morning.   polyethylene glycol (MIRALAX / GLYCOLAX) 17 g packet Take 17 g by mouth daily.   [DISCONTINUED] spironolactone  (ALDACTONE ) 25 MG tablet Take 1 tablet by mouth 2 (two) times daily.   No facility-administered medications prior to visit.   Allergies  Allergen Reactions   Desipramine  Nausea Only and Other (See Comments)    Drowsiness, muscle tightness   Penicillins Rash    Immunization History  Administered Date(s) Administered   Influenza,inj,Quad PF,6+ Mos  11/03/2018, 11/17/2019   Influenza-Unspecified 11/05/2012, 10/27/2015, 10/25/2016, 10/24/2017, 10/17/2022   PFIZER(Purple Top)SARS-COV-2 Vaccination 05/01/2019, 05/29/2019   Tdap 08/06/2010, 11/21/2012, 05/06/2013, 06/25/2023    Health Maintenance  Topic Date Due   COVID-19 Vaccine (3 - 2024-25 season) 11/06/2023 (Originally 10/07/2022)   INFLUENZA VACCINE  09/06/2023   Cervical Cancer Screening (HPV/Pap Cotest)  03/09/2025   DTaP/Tdap/Td (5 - Td or Tdap) 06/24/2033   HPV VACCINES  Aged Out   Meningococcal B Vaccine  Aged Out   Hepatitis C Screening  Discontinued   HIV Screening  Discontinued    Patient Care Team: Dymond Spreen, Asencion Blacksmith, DO as PCP - General (Family Medicine)  Review of Systems  Constitutional:  Negative for chills, fatigue and fever.  HENT:  Negative for congestion, ear pain, rhinorrhea, sneezing and sore throat.   Eyes: Negative.  Negative for pain and redness.  Respiratory:  Negative for cough, shortness of breath and wheezing.   Cardiovascular:  Negative for chest pain and leg swelling.  Gastrointestinal:  Negative for abdominal pain, blood in stool, constipation, diarrhea and nausea.  Endocrine: Negative for polydipsia and polyphagia.  Genitourinary: Negative.  Negative for dysuria, flank pain, hematuria, pelvic pain, vaginal bleeding and vaginal discharge.  Musculoskeletal:  Negative for arthralgias, back pain, gait problem and joint swelling.  Skin:  Negative for rash.  Neurological: Negative.  Negative for dizziness, tremors, seizures, weakness, light-headedness, numbness and headaches.  Hematological:  Negative for adenopathy.  Psychiatric/Behavioral: Negative.  Negative for behavioral problems, confusion and dysphoric mood. The patient is not nervous/anxious and is not hyperactive.         Objective    BP 114/69 (BP Location: Left Arm, Patient Position: Sitting, Cuff Size: Normal)   Pulse 77   Temp 98.8 F (37.1 C) (Oral)   Ht 5\' 8"  (1.727 m)   Wt 144  lb 6.4 oz (65.5 kg)   SpO2 98%   BMI 21.96 kg/m     Physical Exam Vitals and nursing note reviewed.  Constitutional:      General: She is awake.     Appearance: Normal appearance.  HENT:     Head: Normocephalic and atraumatic.     Right Ear: Tympanic membrane, ear canal and external ear normal.     Left Ear: Tympanic membrane, ear canal and external ear normal.     Nose: Nose normal.     Mouth/Throat:     Mouth: Mucous membranes are moist.  Pharynx: Oropharynx is clear. No oropharyngeal exudate or posterior oropharyngeal erythema.  Eyes:     General: No scleral icterus.    Extraocular Movements: Extraocular movements intact.     Conjunctiva/sclera: Conjunctivae normal.     Pupils: Pupils are equal, round, and reactive to light.  Neck:     Thyroid : No thyromegaly or thyroid  tenderness.  Cardiovascular:     Rate and Rhythm: Normal rate and regular rhythm.     Pulses: Normal pulses.     Heart sounds: Normal heart sounds.  Pulmonary:     Effort: Pulmonary effort is normal. No tachypnea, bradypnea or respiratory distress.     Breath sounds: Normal breath sounds. No stridor. No wheezing, rhonchi or rales.  Abdominal:     General: Bowel sounds are normal. There is no distension.     Palpations: Abdomen is soft. There is no mass.     Tenderness: There is no abdominal tenderness. There is no guarding.     Hernia: No hernia is present.  Musculoskeletal:     Cervical back: Normal range of motion and neck supple.     Right lower leg: No edema.     Left lower leg: No edema.  Lymphadenopathy:     Cervical: No cervical adenopathy.  Skin:    General: Skin is warm and dry.  Neurological:     Mental Status: She is alert and oriented to person, place, and time. Mental status is at baseline.  Psychiatric:        Mood and Affect: Mood normal.        Behavior: Behavior normal.     Depression Screen    06/25/2023    2:14 PM 07/11/2020    3:48 PM  PHQ 2/9 Scores  PHQ - 2 Score 0  0  PHQ- 9 Score 4 5   Results for orders placed or performed in visit on 06/25/23  Comprehensive metabolic panel with GFR  Result Value Ref Range   Glucose 88 70 - 99 mg/dL   BUN 8 6 - 24 mg/dL   Creatinine, Ser 1.61 0.57 - 1.00 mg/dL   eGFR 88 >09 UE/AVW/0.98   BUN/Creatinine Ratio 9 9 - 23   Sodium 138 134 - 144 mmol/L   Potassium 3.9 3.5 - 5.2 mmol/L   Chloride 103 96 - 106 mmol/L   CO2 22 20 - 29 mmol/L   Calcium 8.8 8.7 - 10.2 mg/dL   Total Protein 6.7 6.0 - 8.5 g/dL   Albumin 4.4 3.9 - 4.9 g/dL   Globulin, Total 2.3 1.5 - 4.5 g/dL   Bilirubin Total 0.3 0.0 - 1.2 mg/dL   Alkaline Phosphatase 50 44 - 121 IU/L   AST 14 0 - 40 IU/L   ALT 14 0 - 32 IU/L  Lipid panel  Result Value Ref Range   Cholesterol, Total 162 100 - 199 mg/dL   Triglycerides 70 0 - 149 mg/dL   HDL 69 >11 mg/dL   VLDL Cholesterol Cal 14 5 - 40 mg/dL   LDL Chol Calc (NIH) 79 0 - 99 mg/dL   Chol/HDL Ratio 2.3 0.0 - 4.4 ratio  VITAMIN D  25 Hydroxy (Vit-D Deficiency, Fractures)  Result Value Ref Range   Vit D, 25-Hydroxy 56.3 30.0 - 100.0 ng/mL  Vitamin B12  Result Value Ref Range   Vitamin B-12 439 232 - 1,245 pg/mL    Assessment & Plan     Encounter for medical examination to establish care  Irritable bowel syndrome with both  constipation and diarrhea  Elevated vitamin B12 level -     Vitamin B12  Paresthesias -     VITAMIN D  25 Hydroxy (Vit-D Deficiency, Fractures)  Cystic acne -     Spironolactone ; Take 1 tablet (25 mg total) by mouth 2 (two) times daily.  Dispense: 90 tablet; Refill: 3 -     Tretinoin ; Apply topically at bedtime.  Dispense: 45 g; Refill: 1  Screening for lipid disorders -     Lipid panel  Screening for endocrine, metabolic and immunity disorder -     Comprehensive metabolic panel with GFR  Need for Tdap vaccination -     Tdap vaccine greater than or equal to 7yo IM       Encounter for medical examination to establish care Physical exam overall unremarkable  except as noted above. Routine lab work ordered as noted. Overdue for tetanus vaccination. No recent physical exam in two years. Thyroid  function normal. - Administer tetanus vaccine. - Order blood tests for general health screening including cholesterol and B12 levels.  Irritable Bowel Syndrome (IBS) - mixed Chronic IBS with worsening symptoms. Previous treatments had limited success. Pelvic floor dysfunction suspected. Stress may contribute to symptoms. - Proceed with anorectal manometry to evaluate for pelvic floor dysfunction. - Continue low FODMAP diet. - Consider SSRI or SNRI if symptoms persist post-procedure. - Continue to follow with gastroenterology.  Paresthesia of Feet and Toes Intermittent paresthesia, particularly at night. No current B12 supplementation. Previous B12 levels elevated. - Order blood tests to evaluate B12 levels and other relevant parameters.  Pelvic Congestion Syndrome Considered but not a primary concern as source of patient's symptoms. Previous evaluations showed no urgency or significant findings.  Ovarian Cysts Recurrent ovarian cysts with no significant findings on previous evaluations. On oral birth control and has had a tubal ligation.  Seasonal Allergies Managed with loratadine. Continue loratadine 10 mg daily.   Follow-up Plans for post-pelvic floor dysfunction test and blood work results. - Review blood work results and update via chart. - Follow up after pelvic floor dysfunction test results are available.  Return in about 1 year (around 06/24/2024), or if symptoms worsen or fail to improve, for CPE.     I discussed the assessment and treatment plan with the patient  The patient was provided an opportunity to ask questions and all were answered. The patient agreed with the plan and demonstrated an understanding of the instructions.   The patient was advised to call back or seek an in-person evaluation if the symptoms worsen or if the  condition fails to improve as anticipated.    Carlean Charter, DO  [facility] [phone] (phone) [phone] (fax)  [facility]

## 2023-06-26 LAB — COMPREHENSIVE METABOLIC PANEL WITH GFR
ALT: 14 IU/L (ref 0–32)
AST: 14 IU/L (ref 0–40)
Albumin: 4.4 g/dL (ref 3.9–4.9)
Alkaline Phosphatase: 50 IU/L (ref 44–121)
BUN/Creatinine Ratio: 9 (ref 9–23)
BUN: 8 mg/dL (ref 6–24)
Bilirubin Total: 0.3 mg/dL (ref 0.0–1.2)
CO2: 22 mmol/L (ref 20–29)
Calcium: 8.8 mg/dL (ref 8.7–10.2)
Chloride: 103 mmol/L (ref 96–106)
Creatinine, Ser: 0.85 mg/dL (ref 0.57–1.00)
Globulin, Total: 2.3 g/dL (ref 1.5–4.5)
Glucose: 88 mg/dL (ref 70–99)
Potassium: 3.9 mmol/L (ref 3.5–5.2)
Sodium: 138 mmol/L (ref 134–144)
Total Protein: 6.7 g/dL (ref 6.0–8.5)
eGFR: 88 mL/min/{1.73_m2} (ref >59)

## 2023-06-26 LAB — LIPID PANEL
Chol/HDL Ratio: 2.3 ratio (ref 0.0–4.4)
Cholesterol, Total: 162 mg/dL (ref 100–199)
HDL: 69 mg/dL (ref >39)
LDL Chol Calc (NIH): 79 mg/dL (ref 0–99)
Triglycerides: 70 mg/dL (ref 0–149)
VLDL Cholesterol Cal: 14 mg/dL (ref 5–40)

## 2023-06-26 LAB — VITAMIN D 25 HYDROXY (VIT D DEFICIENCY, FRACTURES): Vit D, 25-Hydroxy: 56.3 ng/mL (ref 30.0–100.0)

## 2023-06-26 LAB — VITAMIN B12: Vitamin B-12: 439 pg/mL (ref 232–1245)

## 2023-07-04 ENCOUNTER — Ambulatory Visit: Payer: Self-pay | Admitting: Physician Assistant

## 2023-07-09 ENCOUNTER — Encounter: Payer: Self-pay | Admitting: Family Medicine

## 2023-09-02 ENCOUNTER — Encounter: Payer: Self-pay | Admitting: Family Medicine

## 2023-09-02 DIAGNOSIS — Z3041 Encounter for surveillance of contraceptive pills: Secondary | ICD-10-CM

## 2023-09-05 MED ORDER — JUNEL FE 1/20 1-20 MG-MCG PO TABS: 1.0000 | ORAL_TABLET | Freq: Every day | ORAL | 3 refills | Status: DC

## 2023-09-17 ENCOUNTER — Telehealth: Payer: Self-pay | Admitting: Family Medicine

## 2023-09-17 DIAGNOSIS — L700 Acne vulgaris: Secondary | ICD-10-CM

## 2023-09-17 DIAGNOSIS — Z3041 Encounter for surveillance of contraceptive pills: Secondary | ICD-10-CM

## 2023-09-17 NOTE — Telephone Encounter (Signed)
 LVM for patient to return call. Year supply was sent to [facility] last month, want to confirm she would like [facility] going forward.

## 2023-09-17 NOTE — Telephone Encounter (Signed)
 [facility] faxed refill request for the following medications:   JUNEL  FE 1/20 1-20 MG-MCG tablet    Please advise.

## 2023-09-18 MED ORDER — JUNEL FE 1/20 1-20 MG-MCG PO TABS
1.0000 | ORAL_TABLET | Freq: Every day | ORAL | 3 refills | Status: AC
Start: 2023-09-18 — End: ?

## 2023-09-18 MED ORDER — SPIRONOLACTONE 25 MG PO TABS: 25.0000 mg | ORAL_TABLET | Freq: Two times a day (BID) | ORAL | 3 refills | Status: AC

## 2023-09-18 NOTE — Telephone Encounter (Signed)
 RX sent as requested

## 2023-09-24 ENCOUNTER — Telehealth: Payer: Self-pay

## 2023-09-24 DIAGNOSIS — K5900 Constipation, unspecified: Secondary | ICD-10-CM

## 2023-09-24 NOTE — Telephone Encounter (Signed)
 Copied from CRM #[id number]. Topic: Clinical - Request for Lab/Test Order >> Sep 23, 2023  4:12 PM Ivette P wrote: Reason for CRM: Pt is requesting a lab work order for cortisol levels and wanting to know the cost of the procedure.    Pt would like a follow up call [phone]

## 2023-09-27 NOTE — Addendum Note (Signed)
 Addended by: DONZELLA DOMINO on: 09/27/2023 08:09 AM   Modules accepted: Orders

## 2023-09-27 NOTE — Telephone Encounter (Signed)
 Called patient to inform her that the orders has been put in for Cortisol, that the provider stated she would need one at 8 am and also at 4 pm.  Let her know of the hours for the lab and she verbalized understanding.

## 2023-10-09 LAB — CORTISOL
Cortisol: 28.6 ug/dL — ABNORMAL HIGH (ref 6.2–19.4)
Cortisol: 11.2 ug/dL (ref 6.2–19.4)

## 2023-10-10 ENCOUNTER — Encounter: Payer: Self-pay | Admitting: Family Medicine

## 2023-10-10 ENCOUNTER — Ambulatory Visit: Payer: Self-pay | Admitting: Family Medicine

## 2023-10-10 DIAGNOSIS — R7989 Other specified abnormal findings of blood chemistry: Secondary | ICD-10-CM

## 2023-10-10 DIAGNOSIS — L700 Acne vulgaris: Secondary | ICD-10-CM

## 2023-10-10 NOTE — Telephone Encounter (Signed)
 Please see the message below.

## 2024-01-20 ENCOUNTER — Encounter: Payer: Self-pay | Admitting: Dermatology

## 2024-01-20 ENCOUNTER — Ambulatory Visit: Admitting: Dermatology

## 2024-01-20 DIAGNOSIS — L700 Acne vulgaris: Secondary | ICD-10-CM | POA: Diagnosis not present

## 2024-01-20 DIAGNOSIS — L209 Atopic dermatitis, unspecified: Secondary | ICD-10-CM

## 2024-01-20 DIAGNOSIS — L219 Seborrheic dermatitis, unspecified: Secondary | ICD-10-CM

## 2024-01-20 DIAGNOSIS — L853 Xerosis cutis: Secondary | ICD-10-CM | POA: Diagnosis not present

## 2024-01-20 DIAGNOSIS — Z87442 Personal history of urinary calculi: Secondary | ICD-10-CM | POA: Insufficient documentation

## 2024-01-20 MED ORDER — KETOCONAZOLE 2 % EX CREA: TOPICAL_CREAM | CUTANEOUS | 5 refills | Status: AC

## 2024-01-20 NOTE — Patient Instructions (Addendum)
 Start Ketoconazole  2% cream apply twice daily to affected areas on face/hair line.  Start OTC Hydrocortisone 1%  cream twice a day to affected areas near eyes/eyelids. Stop once improved, resume as needed for flares.    Reduce Tretinoin  until xerosis improves. Reduce to 2 or 3 times per week.      Recommend daily broad spectrum sunscreen SPF 30+ to sun-exposed areas, reapply every 2 hours as needed. Call for new or changing lesions.  Staying in the shade or wearing long sleeves, sun glasses (UVA+UVB protection) and wide brim hats (4-inch brim around the entire circumference of the hat) are also recommended for sun protection.      Due to recent changes in healthcare laws, you may see results of your pathology and/or laboratory studies on MyChart before the doctors have had a chance to review them. We understand that in some cases there may be results that are confusing or concerning to you. Please understand that not all results are received at the same time and often the doctors may need to interpret multiple results in order to provide you with the best plan of care or course of treatment. Therefore, we ask that you please give us  2 business days to thoroughly review all your results before contacting the office for clarification. Should we see a critical lab result, you will be contacted sooner.   If You Need Anything After Your Visit  If you have any questions or concerns for your doctor, please call our main line at [phone] and press option 4 to reach your doctor's medical assistant. If no one answers, please leave a voicemail as directed and we will return your call as soon as possible. Messages left after 4 pm will be answered the following business day.   You may also send us  a message via MyChart. We typically respond to MyChart messages within 1-2 business days.  For prescription refills, please ask your pharmacy to contact our office. Our fax number is [phone].  If you  have an urgent issue when the clinic is closed that cannot wait until the next business day, you can page your doctor at the number below.    Please note that while we do our best to be available for urgent issues outside of office hours, we are not available 24/7.   If you have an urgent issue and are unable to reach us , you may choose to seek medical care at your doctor's office, retail clinic, urgent care center, or emergency room.  If you have a medical emergency, please immediately call 911 or go to the emergency department.  Pager Numbers  - Dr. Hester: [phone]  - Dr. Jackquline: [phone]  - Dr. Claudene: [phone]   - Dr. Raymund: [phone]  In the event of inclement weather, please call our main line at [phone] for an update on the status of any delays or closures.  Dermatology Medication Tips: Please keep the boxes that topical medications come in in order to help keep track of the instructions about where and how to use these. Pharmacies typically print the medication instructions only on the boxes and not directly on the medication tubes.   If your medication is too expensive, please contact our office at [phone] option 4 or send us  a message through MyChart.   We are unable to tell what your co-pay for medications will be in advance as this is different depending on your insurance coverage. However, we may be able to find a substitute medication  at lower cost or fill out paperwork to get insurance to cover a needed medication.   If a prior authorization is required to get your medication covered by your insurance company, please allow us  1-2 business days to complete this process.  Drug prices often vary depending on where the prescription is filled and some pharmacies may offer cheaper prices.  The website www.goodrx.com contains coupons for medications through different pharmacies. The prices here do not account for what the cost may be with help from  insurance (it may be cheaper with your insurance), but the website can give you the price if you did not use any insurance.  - You can print the associated coupon and take it with your prescription to the pharmacy.  - You may also stop by our office during regular business hours and pick up a GoodRx coupon card.  - If you need your prescription sent electronically to a different pharmacy, notify our office through [facility] or by phone at [phone] option 4.     Si Usted Necesita Algo Despus de Su Visita  Tambin puede enviarnos un mensaje a travs de Clinical Cytogeneticist. Por lo general respondemos a los mensajes de MyChart en el transcurso de 1 a 2 das hbiles.  Para renovar recetas, por favor pida a su farmacia que se ponga en contacto con nuestra oficina. Randi lakes de fax es [place] [phone].  Si tiene un asunto urgente cuando la clnica est cerrada y que no puede esperar hasta el siguiente da hbil, puede llamar/localizar a su doctor(a) al nmero que aparece a continuacin.   Por favor, tenga en cuenta que aunque hacemos todo lo posible para estar disponibles para asuntos urgentes fuera del horario de [place], no estamos disponibles las 24 horas del da, los 7 [address]  [address] de la [place].   Si tiene un problema urgente y no puede comunicarse con nosotros, puede optar por buscar atencin mdica  en el consultorio de su doctor(a), en una clnica privada, en un centro de atencin urgente o en una sala de emergencias.  Si tiene engineer, drilling, por favor llame inmediatamente al 911 o vaya a la sala de emergencias.  Nmeros de bper  - Dr. Hester: [phone]  - Dra. Jackquline: [phone]  - Dr. Claudene: [phone]  - Dra. Kitts: [phone]  En caso de inclemencias del [place], por favor llame a nuestra lnea principal al [phone] para una actualizacin sobre el estado de cualquier retraso o cierre.  Consejos para la medicacin en dermatologa: Por favor, guarde las  cajas en las que vienen los medicamentos de uso tpico para ayudarle a seguir las instrucciones sobre dnde y cmo usarlos. Las farmacias generalmente imprimen las instrucciones del medicamento slo en las cajas y no directamente en los tubos del [place].   Si su medicamento es muy caro, por favor, pngase en contacto con landry rieger llamando al [phone] y presione la opcin 4 o envenos un mensaje a travs de Clinical Cytogeneticist.   No podemos decirle cul ser su copago por los medicamentos por adelantado ya que esto es diferente dependiendo de la cobertura de su seguro. Sin embargo, es posible que podamos encontrar un medicamento sustituto a audiological scientist un formulario para que el seguro cubra el medicamento que se considera necesario.   Si se requiere una autorizacin previa para que su compaa de seguros cubra su medicamento, por favor permtanos de 1 a 2 das hbiles para completar este proceso.  Los precios de los medicamentos varan con frecuencia  dependiendo del lugar de dnde se surte la receta y alguna farmacias pueden ofrecer precios ms baratos.  El sitio web www.goodrx.com tiene cupones para medicamentos de health and safety inspector. Los precios aqu no tienen en cuenta lo que podra costar con la ayuda del seguro (puede ser ms barato con su seguro), pero el sitio web puede darle el precio si no utiliz tourist information centre manager.  - Puede imprimir el cupn correspondiente y llevarlo con su receta a la farmacia.  - Tambin puede pasar por nuestra oficina durante el horario de atencin regular y education officer, museum una tarjeta de cupones de GoodRx.  - Si necesita que su receta se enve electrnicamente a una farmacia diferente, informe a nuestra oficina a travs de MyChart de [place] o por telfono llamando al [phone] y presione la opcin 4.

## 2024-01-20 NOTE — Progress Notes (Signed)
° °  New Patient Visit   Subjective  Jordan Schwartz is a 43 y.o. female who presents for the following: dry skin recently on face. Dry patch over eyelids. Has gone back to original facial care regimen and has not improved. Now spreading to scalp. States she usually has oily skin.   Acne. Taking spironolactone  25 mg 2 po every day. Using Tretinoin  0.05% cream at bedtime. Hx of high testosterone.   The following portions of the chart were reviewed this encounter and updated as appropriate: medications, allergies, medical history  Review of Systems:  No other skin or systemic complaints except as noted in HPI or Assessment and Plan.  Objective  Well appearing patient in no apparent distress; mood and affect are within normal limits.  A focused examination was performed of the following areas: Face, scalp  Relevant exam findings are noted in the Assessment and Plan.    Assessment & Plan   SEBORRHEIC DERMATITIS Exam: erythematous scaly patches nasolabial folds, temporal scalp  Chronic and persistent condition with duration or expected duration over one year. Condition is bothersome/symptomatic for patient. Currently flared.   Seborrheic Dermatitis is a chronic persistent rash characterized by pinkness and scaling most commonly of the mid face but also can occur on the scalp (dandruff), ears; mid chest, mid back and groin.  It tends to be exacerbated by stress and cooler weather.  People who have neurologic disease may experience new onset or exacerbation of existing seborrheic dermatitis.  The condition is not curable but treatable and can be controlled.  Treatment Plan: Exacerbated by dry air, tretinoin  Start Ketoconazole  2% cream apply twice daily to affected areas on face/hair line.  Start OTC Hydrocortisone 1%  cream twice a day to affected areas near eyes/eyelids. Stop once improved, resume as needed for flares.    Xerosis/atopic dermatitis Exam: red scaly papule L medial upper  eyelid  Plan: - Exacerbated by dry air, tretinoin , testosterone supplementation - diffuse xerotic patches at forehead and temporal scalp. - recommend gentle, hydrating skin care - gentle skin care handout given   ACNE VULGARIS Exam: inflammatory papules at chin/jaw line  Chronic and persistent condition with duration or expected duration over one year. Condition is bothersome/symptomatic for patient. Currently flared.   Treatment Plan: Exacerbated by supplemental testosterone reduce Tretinoin  0.05% cream to 2-3x weekly on face at bedtime   SEBORRHEIC DERMATITIS   ATOPIC DERMATITIS, UNSPECIFIED TYPE   XEROSIS CUTIS   ACNE VULGARIS    Return if symptoms worsen or fail to improve.  I, Jill Parcell, CMA, am acting as scribe for Boneta Sharps, MD.   Documentation: I have reviewed the above documentation for accuracy and completeness, and I agree with the above.  Boneta Sharps, MD

## 2024-01-22 DIAGNOSIS — Z1231 Encounter for screening mammogram for malignant neoplasm of breast: Secondary | ICD-10-CM

## 2024-05-06 ENCOUNTER — Ambulatory Visit: Admitting: "Endocrinology

## 2024-05-20 ENCOUNTER — Ambulatory Visit
